# Patient Record
Sex: Female | Born: 1970 | Race: Black or African American | Hispanic: No | Marital: Single | Smoking: Never smoker
Health system: Southern US, Community
[De-identification: ages and names within clinical notes are randomized; demographics above are authoritative.]

## PROBLEM LIST (undated history)

## (undated) DIAGNOSIS — D649 Anemia, unspecified: Secondary | ICD-10-CM

## (undated) DIAGNOSIS — D219 Benign neoplasm of connective and other soft tissue, unspecified: Secondary | ICD-10-CM

## (undated) DIAGNOSIS — Z789 Other specified health status: Secondary | ICD-10-CM

## (undated) HISTORY — DX: Benign neoplasm of connective and other soft tissue, unspecified: D21.9

## (undated) HISTORY — DX: Other specified health status: Z78.9

---

## 1994-09-08 HISTORY — PX: TUBAL LIGATION: SHX77

## 1998-10-29 ENCOUNTER — Emergency Department (HOSPITAL_COMMUNITY): Admission: EM | Admit: 1998-10-29 | Discharge: 1998-10-29 | Payer: Self-pay

## 1999-02-13 ENCOUNTER — Encounter: Payer: Self-pay | Admitting: Emergency Medicine

## 1999-02-13 ENCOUNTER — Emergency Department (HOSPITAL_COMMUNITY): Admission: EM | Admit: 1999-02-13 | Discharge: 1999-02-13 | Payer: Self-pay | Admitting: Emergency Medicine

## 2000-04-03 ENCOUNTER — Emergency Department (HOSPITAL_COMMUNITY): Admission: EM | Admit: 2000-04-03 | Discharge: 2000-04-03 | Payer: Self-pay | Admitting: Emergency Medicine

## 2005-11-05 ENCOUNTER — Emergency Department (HOSPITAL_COMMUNITY): Admission: EM | Admit: 2005-11-05 | Discharge: 2005-11-05 | Payer: Self-pay | Admitting: Emergency Medicine

## 2008-01-11 ENCOUNTER — Encounter (INDEPENDENT_AMBULATORY_CARE_PROVIDER_SITE_OTHER): Payer: Self-pay | Admitting: Family Medicine

## 2009-10-09 ENCOUNTER — Emergency Department (HOSPITAL_COMMUNITY): Admission: EM | Admit: 2009-10-09 | Discharge: 2009-10-09 | Payer: Self-pay | Admitting: Emergency Medicine

## 2010-10-11 ENCOUNTER — Encounter: Payer: Self-pay | Admitting: *Deleted

## 2010-10-11 ENCOUNTER — Inpatient Hospital Stay (HOSPITAL_COMMUNITY)
Admission: RE | Admit: 2010-10-11 | Discharge: 2010-10-11 | Disposition: A | Discharge status: Home / Self Care | Payer: Self-pay | Source: Ambulatory Visit | Attending: Family Medicine | Admitting: Family Medicine

## 2010-10-11 ENCOUNTER — Emergency Department (HOSPITAL_COMMUNITY)
Admission: EM | Admit: 2010-10-11 | Discharge: 2010-10-11 | Disposition: A | Discharge status: Home / Self Care | Payer: Medicaid Other | Attending: Emergency Medicine | Admitting: Emergency Medicine

## 2010-10-11 DIAGNOSIS — R112 Nausea with vomiting, unspecified: Secondary | ICD-10-CM | POA: Insufficient documentation

## 2010-10-11 DIAGNOSIS — A5903 Trichomonal cystitis and urethritis: Secondary | ICD-10-CM | POA: Insufficient documentation

## 2010-10-11 DIAGNOSIS — R197 Diarrhea, unspecified: Secondary | ICD-10-CM | POA: Insufficient documentation

## 2010-10-11 LAB — POCT I-STAT, CHEM 8
BUN: 11 mg/dL (ref 6–23)
Calcium, Ion: 1.16 mmol/L (ref 1.12–1.32)
Chloride: 107 mEq/L (ref 96–112)
Creatinine, Ser: 0.8 mg/dL (ref 0.4–1.2)
Glucose, Bld: 115 mg/dL — ABNORMAL HIGH (ref 70–99)
HCT: 36 % (ref 36.0–46.0)
Hemoglobin: 12.2 g/dL (ref 12.0–15.0)
Potassium: 3.6 mEq/L (ref 3.5–5.1)
Sodium: 141 mEq/L (ref 135–145)
TCO2: 24 mmol/L (ref 0–100)

## 2010-10-11 LAB — URINALYSIS, ROUTINE W REFLEX MICROSCOPIC
Bilirubin Urine: NEGATIVE
Hgb urine dipstick: NEGATIVE
Ketones, ur: NEGATIVE mg/dL
Nitrite: NEGATIVE
Protein, ur: NEGATIVE mg/dL
Specific Gravity, Urine: 1.017 (ref 1.005–1.030)
Urine Glucose, Fasting: NEGATIVE mg/dL
Urobilinogen, UA: 0.2 mg/dL (ref 0.0–1.0)
pH: 5.5 (ref 5.0–8.0)

## 2010-10-11 LAB — URINE MICROSCOPIC-ADD ON

## 2010-10-11 LAB — POCT PREGNANCY, URINE: Preg Test, Ur: NEGATIVE

## 2010-10-13 LAB — URINE CULTURE
Colony Count: 10000
Culture  Setup Time: 201202040140

## 2010-10-16 NOTE — Miscellaneous (Signed)
Summary: [facility] asked for NPI  Clinical Lists Changes Ida from [facility] left a message asking for our NPI. She has never been here. lm for Ida to call back.Golden [address] RN  October 11, 2010 9:51 AM

## 2012-11-30 ENCOUNTER — Emergency Department (HOSPITAL_COMMUNITY)
Admission: EM | Admit: 2012-11-30 | Discharge: 2012-11-30 | Disposition: A | Discharge status: Home / Self Care | Payer: Medicaid Other | Attending: Emergency Medicine | Admitting: Emergency Medicine

## 2012-11-30 ENCOUNTER — Encounter (HOSPITAL_COMMUNITY): Payer: Self-pay | Admitting: *Deleted

## 2012-11-30 DIAGNOSIS — A5901 Trichomonal vulvovaginitis: Secondary | ICD-10-CM | POA: Insufficient documentation

## 2012-11-30 DIAGNOSIS — R350 Frequency of micturition: Secondary | ICD-10-CM | POA: Insufficient documentation

## 2012-11-30 DIAGNOSIS — M549 Dorsalgia, unspecified: Secondary | ICD-10-CM

## 2012-11-30 DIAGNOSIS — M546 Pain in thoracic spine: Secondary | ICD-10-CM | POA: Insufficient documentation

## 2012-11-30 DIAGNOSIS — A599 Trichomoniasis, unspecified: Secondary | ICD-10-CM

## 2012-11-30 DIAGNOSIS — Z3202 Encounter for pregnancy test, result negative: Secondary | ICD-10-CM | POA: Insufficient documentation

## 2012-11-30 DIAGNOSIS — R102 Pelvic and perineal pain: Secondary | ICD-10-CM

## 2012-11-30 DIAGNOSIS — Z7251 High risk heterosexual behavior: Secondary | ICD-10-CM | POA: Insufficient documentation

## 2012-11-30 DIAGNOSIS — N949 Unspecified condition associated with female genital organs and menstrual cycle: Secondary | ICD-10-CM | POA: Insufficient documentation

## 2012-11-30 LAB — URINALYSIS, ROUTINE W REFLEX MICROSCOPIC
Bilirubin Urine: NEGATIVE
Glucose, UA: NEGATIVE mg/dL
Ketones, ur: NEGATIVE mg/dL
Specific Gravity, Urine: 1.017 (ref 1.005–1.030)
pH: 7.5 (ref 5.0–8.0)

## 2012-11-30 LAB — POCT PREGNANCY, URINE: Preg Test, Ur: NEGATIVE

## 2012-11-30 LAB — URINE MICROSCOPIC-ADD ON

## 2012-11-30 LAB — WET PREP, GENITAL: Yeast Wet Prep HPF POC: NONE SEEN

## 2012-11-30 MED ORDER — AZITHROMYCIN 250 MG PO TABS
1000.0000 mg | ORAL_TABLET | Freq: Once | ORAL | Status: AC
  Administered 2012-11-30: 1000 mg via ORAL
  Filled 2012-11-30: qty 4

## 2012-11-30 MED ORDER — CEFTRIAXONE SODIUM 250 MG IJ SOLR
250.0000 mg | Freq: Once | INTRAMUSCULAR | Status: AC
  Administered 2012-11-30: 250 mg via INTRAMUSCULAR
  Filled 2012-11-30: qty 250

## 2012-11-30 MED ORDER — METRONIDAZOLE 500 MG PO TABS
2000.0000 mg | ORAL_TABLET | Freq: Once | ORAL | Status: AC
  Administered 2012-11-30: 2000 mg via ORAL
  Filled 2012-11-30: qty 4

## 2012-11-30 NOTE — ED Notes (Signed)
Pt reports back pain x1 month. Abd pain x2 weeks. Denies n/v/d, constipation. Pt reports urinary frequency, urgency x1 month. Also reports she has been having unprotected sex with her ex and concerned for stds. Denies sx at this time.

## 2012-11-30 NOTE — ED Provider Notes (Signed)
Medical screening examination/treatment/procedure(s) were performed by non-physician practitioner and as supervising physician I was immediately available for consultation/collaboration.  Naphtali Zywicki R. Denaisha Swango, MD 11/30/12 2307 

## 2012-11-30 NOTE — ED Provider Notes (Signed)
History     CSN: [id number]  Arrival date & time 11/30/12  1804   First MD Initiated Contact with Patient 11/30/12 1826      Chief Complaint  Patient presents with  . Abdominal Pain  . Back Pain    (Consider location/radiation/quality/duration/timing/severity/associated sxs/prior treatment) Patient is a 42 y.o. female presenting with abdominal pain and back pain. The history is provided by the patient. No language interpreter was used.  Abdominal Pain Pain location:  Suprapubic and LLQ Pain quality: cramping   Pain radiates to:  Suprapubic region Pain severity:  Moderate Associated symptoms: no chills, no dysuria, no fever, no nausea and no vomiting   Back Pain Associated symptoms: abdominal pain and pelvic pain   Associated symptoms: no dysuria and no fever    Abdominal pain that started about 2 weeks ago that is moderate in severity and cramping in nature. Location: lower abdomen, in left suprapubic region  Radiates throughout abdomen Prior episodes: none Prior endoscopies or abd surgeries: tubleligation  Prior US or CT (when? What): none Prior hx of similar pain (do they have antiemetic or pain meds): no hx of similar pain Pt does not have a OB/GYN.  States she has recently been having unprotected sex with her ex and is concerned for STDs.  Denies fever, n/v/d, dysuria, or vaginal discharge.  LMP last week and "normal" for patient.  Pt also mentioned right back pain in thoracic region.  States pain is sharp in nature, waxes and wanes.  Has been going on for about 28mo.  No known trauma or specific injury.  Has tried ibuprofen with moderate relief.  History reviewed. No pertinent past medical history.  History reviewed. No pertinent past surgical history.  No family history on file.  History  Substance Use Topics  . Smoking status: Never Smoker   . Smokeless tobacco: Not on file  . Alcohol Use: Yes     Comment: occasionally    OB History   Grav Para Term  Preterm Abortions TAB SAB Ect Mult Living                  Review of Systems  Constitutional: Negative for fever and chills.  Gastrointestinal: Positive for abdominal pain. Negative for nausea and vomiting.  Genitourinary: Positive for pelvic pain. Negative for dysuria.  Musculoskeletal: Positive for back pain.    Allergies  Review of patient's allergies indicates no known allergies.  Home Medications  No current outpatient prescriptions on file.  BP 136/74  Pulse 82  Temp(Src) 98.7 F (37.1 C) (Oral)  Resp 16  SpO2 100%  LMP 11/23/2012  Physical Exam  Nursing note and vitals reviewed. Constitutional: She appears well-developed and well-nourished. No distress.  HENT:  Head: Normocephalic and atraumatic.  Eyes: Conjunctivae are normal.  Neck: Normal range of motion.  Cardiovascular: Normal rate, regular rhythm and normal heart sounds.   Pulmonary/Chest: Effort normal and breath sounds normal.  Abdominal: Soft. Bowel sounds are normal. She exhibits no distension and no mass. There is tenderness ( moderate TTP in suprapubic region). There is no rebound and no guarding.  Genitourinary: Uterus normal. Vaginal discharge ( moderate amount of thick white discharge) found.  External genitalia normal.  No lesions, erythema or masses.  Pelvic exam: moderate amount of white thick discharge, vaginal canal smooth without lesions or masses.  Cervical os closed with white discharge.  No erythema or lesions.   Bimanual: no cervical motion tenderness or adnexal masses  Musculoskeletal: Normal range of motion.  Neurological: She is alert.  Skin: Skin is dry. She is not diaphoretic.    ED Course  Procedures (including critical care time)  Labs Reviewed  WET PREP, GENITAL - Abnormal; Notable for the following:    Trich, Wet Prep MANY (*)    Clue Cells Wet Prep HPF POC FEW (*)    WBC, Wet Prep HPF POC FEW (*)    All other components within normal limits  URINALYSIS, ROUTINE W REFLEX  MICROSCOPIC - Abnormal; Notable for the following:    Leukocytes, UA SMALL (*)    All other components within normal limits  GC/CHLAMYDIA PROBE AMP  URINE MICROSCOPIC-ADD ON  POCT PREGNANCY, URINE   No results found.   1. Trichomoniasis   2. Pelvic pain   3. Back pain       MDM  Pt was concerned for STDs after unprotected sex with ex.  Has had suprapubic pain for 2 weeks.  Denies n/v/d, dysuria, or fever.  Has had urinary frequency x31mo.  LMP last week and reported as "normal" for pt.  Pelvic exam significant for moderate amount of white thick discharge.  No cervical motion tenderness or adnexal masses.  No external or internal lesions or erythema.  HCG: neg UA: not suggestive of UTI Wet prep: many trich with few clue cells  Will tx pt empirically for gonorrhea and clamydia based on symptoms and pelvic exam and tx for trichomoniasis based on wet prep. Ceftriaxone 250mg  IM and Azythromycin 1g PO and Metronidazole 2g PO  For back pain, believed to be non-related to suprapubic pain and STD.  Based on hx and physical, likely due to musculoskeletal pain.  Advised pt to continue use of ibuprofen.  F/u with primary care in a week or two if pain persists.  May also use ice or heat as desired for pain.  Will discharge pt home.  Have pt inform partner needs to be tested and treated for STDs.  Pt should f/u with health clinic or primary care in a week to make sure infection has cleared.  Vitals: unremarkable. Discharged in stable condition.    Discussed pt with attending during ED encounter.         Junius Finner, PA-C 11/30/12 2100

## 2012-12-01 LAB — GC/CHLAMYDIA PROBE AMP
CT Probe RNA: NEGATIVE
GC Probe RNA: NEGATIVE

## 2014-03-13 ENCOUNTER — Emergency Department (HOSPITAL_COMMUNITY)
Admission: EM | Admit: 2014-03-13 | Discharge: 2014-03-13 | Disposition: A | Discharge status: Home / Self Care | Payer: Medicaid Other | Attending: Emergency Medicine | Admitting: Emergency Medicine

## 2014-03-13 ENCOUNTER — Encounter (HOSPITAL_COMMUNITY): Payer: Self-pay | Admitting: Emergency Medicine

## 2014-03-13 DIAGNOSIS — L299 Pruritus, unspecified: Secondary | ICD-10-CM

## 2014-03-13 DIAGNOSIS — R21 Rash and other nonspecific skin eruption: Secondary | ICD-10-CM

## 2014-03-13 DIAGNOSIS — L259 Unspecified contact dermatitis, unspecified cause: Secondary | ICD-10-CM | POA: Insufficient documentation

## 2014-03-13 MED ORDER — DIPHENHYDRAMINE HCL 25 MG PO TABS: 25.0000 mg | ORAL_TABLET | Freq: Four times a day (QID) | ORAL | Status: DC | PRN

## 2014-03-13 MED ORDER — DIPHENHYDRAMINE HCL 25 MG PO CAPS
25.0000 mg | ORAL_CAPSULE | Freq: Once | ORAL | Status: AC
  Administered 2014-03-13: 25 mg via ORAL
  Filled 2014-03-13: qty 1

## 2014-03-13 MED ORDER — PREDNISONE 20 MG PO TABS
60.0000 mg | ORAL_TABLET | Freq: Once | ORAL | Status: AC
  Administered 2014-03-13: 60 mg via ORAL
  Filled 2014-03-13: qty 3

## 2014-03-13 MED ORDER — PREDNISONE 20 MG PO TABS: ORAL_TABLET | ORAL | Status: DC

## 2014-03-13 NOTE — ED Notes (Signed)
Pt arrived to the ED with a complaint of a rash.  Pt has raised petechiae on her upper body.  Pt states she noticed the rash start after she cleaned a apartment.  Pt states the rash has been present for 3 days.

## 2014-03-13 NOTE — Discharge Instructions (Signed)
Keep skin clean/dry. Avoid using that new oil, also avoid other perfumed/scented body products for the next several days.  You may use mild, unscented soaps such as dove.  Take prednisone as prescribed. Take benadryl as need for itching.  Benadryl may cause drowsiness - no driving if/when feeling drowsy. Follow up with primary care doctor/dermatologist in 1 week if symptoms fail to improve/resolve. Return to ER if worse, new symptoms, fevers, trouble breathing, other concern.     Contact Dermatitis Contact dermatitis is a reaction to certain substances that touch the skin. Contact dermatitis can be either irritant contact dermatitis or allergic contact dermatitis. Irritant contact dermatitis does not require previous exposure to the substance for a reaction to occur.Allergic contact dermatitis only occurs if you have been exposed to the substance before. Upon a repeat exposure, your body reacts to the substance.  CAUSES  Many substances can cause contact dermatitis. Irritant dermatitis is most commonly caused by repeated exposure to mildly irritating substances, such as:  Makeup.  Soaps.  Detergents.  Bleaches.  Acids.  Metal salts, such as nickel. Allergic contact dermatitis is most commonly caused by exposure to:  Poisonous plants.  Chemicals (deodorants, shampoos).  Jewelry.  Latex.  Neomycin in triple antibiotic cream.  Preservatives in products, including clothing. SYMPTOMS  The area of skin that is exposed may develop:  Dryness or flaking.  Redness.  Cracks.  Itching.  Pain or a burning sensation.  Blisters. With allergic contact dermatitis, there may also be swelling in areas such as the eyelids, mouth, or genitals.  DIAGNOSIS  Your caregiver can usually tell what the problem is by doing a physical exam. In cases where the cause is uncertain and an allergic contact dermatitis is suspected, a patch skin test may be performed to help determine the cause  of your dermatitis. TREATMENT Treatment includes protecting the skin from further contact with the irritating substance by avoiding that substance if possible. Barrier creams, powders, and gloves may be helpful. Your caregiver may also recommend:  Steroid creams or ointments applied 2 times daily. For best results, soak the rash area in cool water for 20 minutes. Then apply the medicine. Cover the area with a plastic wrap. You can store the steroid cream in the refrigerator for a "chilly" effect on your rash. That may decrease itching. Oral steroid medicines may be needed in more severe cases.  Antibiotics or antibacterial ointments if a skin infection is present.  Antihistamine lotion or an antihistamine taken by mouth to ease itching.  Lubricants to keep moisture in your skin.  Burow's solution to reduce redness and soreness or to dry a weeping rash. Mix one packet or tablet of solution in 2 cups cool water. Dip a clean washcloth in the mixture, wring it out a bit, and put it on the affected area. Leave the cloth in place for 30 minutes. Do this as often as possible throughout the day.  Taking several cornstarch or baking soda baths daily if the area is too large to cover with a washcloth. Harsh chemicals, such as alkalis or acids, can cause skin damage that is like a burn. You should flush your skin for 15 to 20 minutes with cold water after such an exposure. You should also seek immediate medical care after exposure. Bandages (dressings), antibiotics, and pain medicine may be needed for severely irritated skin.  HOME CARE INSTRUCTIONS  Avoid the substance that caused your reaction.  Keep the area of skin that is affected away from hot  water, soap, sunlight, chemicals, acidic substances, or anything else that would irritate your skin.  Do not scratch the rash. Scratching may cause the rash to become infected.  You may take cool baths to help stop the itching.  Only take over-the-counter  or prescription medicines as directed by your caregiver.  See your caregiver for follow-up care as directed to make sure your skin is healing properly. SEEK MEDICAL CARE IF:   Your condition is not better after 3 days of treatment.  You seem to be getting worse.  You see signs of infection such as swelling, tenderness, redness, soreness, or warmth in the affected area.  You have any problems related to your medicines. Document Released: 08/22/2000 Document Revised: 11/17/2011 Document Reviewed: 01/28/2011 [facility] Patient Information 2015 [place], [place]. This information is not intended to replace advice given to you by your health care provider. Make sure you discuss any questions you have with your health care provider.

## 2014-03-13 NOTE — ED Provider Notes (Signed)
CSN: [id number]     Arrival date & time 03/13/14  0719 History   First MD Initiated Contact with Patient 03/13/14 [phone]     Chief Complaint  Patient presents with  . Rash     (Consider location/radiation/quality/duration/timing/severity/associated sxs/prior Treatment) Patient is a 43 y.o. female presenting with rash. The history is provided by the patient.  Rash Associated symptoms: no abdominal pain, no diarrhea, no fever, no headaches, no shortness of breath, no sore throat and not vomiting   pt c/o pruritic rash to upper chest/base neck, bilateral arms for the past 2-3 days. Constant. Moderate. Denies hx similar rash. States had recently started using new scented body oil.  No other recent change in any home or personal products.  No recent new meds.  No change in foods/diet. States also had done some recent cleaning in a dirty apartment, but denies known chemical or other exposure to skin that environment. Denies outdoor/yard work or exposure. Recent health otherwise at baseline, no recent febrile illness. No uri c/o. States otherwise feels well, not ill.       History reviewed. No pertinent past medical history. History reviewed. No pertinent past surgical history. History reviewed. No pertinent family history. History  Substance Use Topics  . Smoking status: Never Smoker   . Smokeless tobacco: Not on file  . Alcohol Use: Yes     Comment: occasionally   OB History   Grav Para Term Preterm Abortions TAB SAB Ect Mult Living                 Review of Systems  Constitutional: Negative for fever and chills.  HENT: Negative for sore throat.   Eyes: Negative for pain, discharge and redness.  Respiratory: Negative for cough and shortness of breath.   Cardiovascular: Negative for chest pain and leg swelling.  Gastrointestinal: Negative for vomiting, abdominal pain and diarrhea.  Genitourinary: Negative for dysuria and flank pain.  Musculoskeletal: Negative for back pain and neck  pain.  Skin: Positive for rash.  Neurological: Negative for headaches.  Hematological: Does not bruise/bleed easily.  Psychiatric/Behavioral: Negative for confusion.      Allergies  Review of patient's allergies indicates no known allergies.  Home Medications   Prior to Admission medications   Medication Sig Start Date End Date Taking? Authorizing Provider  diphenhydrAMINE (BENADRYL) 25 MG tablet Take 1 tablet (25 mg total) by mouth every 6 (six) hours as needed. 03/13/14   Mirna Mires, MD  predniSONE (DELTASONE) 20 MG tablet 3 po once a day for 2 days, then 2 po once a day for 3 days, then 1 po once a day for 3 days 03/13/14   Mirna Mires, MD   BP 118/58  Pulse 65  Temp(Src) 98.5 F (36.9 C) (Oral)  Resp 16  Ht 5\' 4"  (1.626 m)  Wt 150 lb (68.04 kg)  BMI 25.73 kg/m2  SpO2 100%  LMP 03/03/2014 Physical Exam  Nursing note and vitals reviewed. Constitutional: She appears well-developed and well-nourished. No distress.  HENT:  Nose: Nose normal.  Mouth/Throat: Oropharynx is clear and moist.  No mm involvement of rash  Eyes: Conjunctivae are normal. Right eye exhibits no discharge. Left eye exhibits no discharge. No scleral icterus.  Neck: Neck supple. No tracheal deviation present.  Cardiovascular: Normal rate, regular rhythm, normal heart sounds and intact distal pulses.   Pulmonary/Chest: Effort normal and breath sounds normal. No respiratory distress. She has no wheezes.  Abdominal: Soft. Normal appearance. She exhibits no  distension. There is no tenderness.  Musculoskeletal: She exhibits no edema and no tenderness.  Lymphadenopathy:    She has no cervical adenopathy.  Neurological: She is alert.  Skin: Skin is warm and dry. Rash noted. She is not diaphoretic.  Blotchy, erythematous, sl raised rash to neck/base or neck, upper chest, bilateral arms c/w contact dermatitis. Few linear areas. No petechia or purpura. No target or iris lesions. No mucous membrane lesions. No  cellulitis.   Psychiatric: She has a normal mood and affect.    ED Course  Procedures (including critical care time)   MDM  No meds pta this morning.   Confirmed nkda w pt.  Benadryl po, pred po.  Reviewed nursing notes and prior charts for additional history.       Mirna Mires, MD 03/13/14 [phone]

## 2018-01-17 DIAGNOSIS — D500 Iron deficiency anemia secondary to blood loss (chronic): Secondary | ICD-10-CM | POA: Insufficient documentation

## 2018-01-17 DIAGNOSIS — N920 Excessive and frequent menstruation with regular cycle: Principal | ICD-10-CM | POA: Insufficient documentation

## 2018-01-17 DIAGNOSIS — D250 Submucous leiomyoma of uterus: Secondary | ICD-10-CM | POA: Insufficient documentation

## 2018-01-18 ENCOUNTER — Emergency Department (HOSPITAL_COMMUNITY): Payer: Self-pay

## 2018-01-18 ENCOUNTER — Encounter (HOSPITAL_COMMUNITY): Payer: Self-pay

## 2018-01-18 ENCOUNTER — Telehealth: Payer: Self-pay | Admitting: General Practice

## 2018-01-18 ENCOUNTER — Other Ambulatory Visit: Payer: Self-pay

## 2018-01-18 ENCOUNTER — Observation Stay (HOSPITAL_COMMUNITY)
Admission: EM | Admit: 2018-01-18 | Discharge: 2018-01-19 | Disposition: A | Discharge status: Home / Self Care | Payer: Self-pay | Attending: Internal Medicine | Admitting: Internal Medicine

## 2018-01-18 DIAGNOSIS — N921 Excessive and frequent menstruation with irregular cycle: Secondary | ICD-10-CM

## 2018-01-18 DIAGNOSIS — D259 Leiomyoma of uterus, unspecified: Secondary | ICD-10-CM

## 2018-01-18 DIAGNOSIS — N920 Excessive and frequent menstruation with regular cycle: Secondary | ICD-10-CM | POA: Diagnosis present

## 2018-01-18 DIAGNOSIS — D649 Anemia, unspecified: Secondary | ICD-10-CM | POA: Diagnosis present

## 2018-01-18 DIAGNOSIS — N939 Abnormal uterine and vaginal bleeding, unspecified: Secondary | ICD-10-CM

## 2018-01-18 DIAGNOSIS — D500 Iron deficiency anemia secondary to blood loss (chronic): Secondary | ICD-10-CM

## 2018-01-18 LAB — RETICULOCYTES
RBC.: 1.95 MIL/uL — AB (ref 3.87–5.11)
RETIC COUNT ABSOLUTE: 19.5 10*3/uL (ref 19.0–186.0)
Retic Ct Pct: 1 % (ref 0.4–3.1)

## 2018-01-18 LAB — APTT: aPTT: 25 seconds (ref 24–36)

## 2018-01-18 LAB — CBC
HCT: 14.6 % — ABNORMAL LOW (ref 36.0–46.0)
Hemoglobin: 3.8 g/dL — CL (ref 12.0–15.0)
MCH: 14.9 pg — ABNORMAL LOW (ref 26.0–34.0)
MCHC: 26 g/dL — ABNORMAL LOW (ref 30.0–36.0)
MCV: 57.3 fL — AB (ref 78.0–100.0)
PLATELETS: 327 10*3/uL (ref 150–400)
RBC: 2.55 MIL/uL — AB (ref 3.87–5.11)
RDW: 19.7 % — AB (ref 11.5–15.5)
WBC: 4.7 10*3/uL (ref 4.0–10.5)

## 2018-01-18 LAB — IRON AND TIBC
Iron: 6 ug/dL — ABNORMAL LOW (ref 28–170)
Saturation Ratios: 2 % — ABNORMAL LOW (ref 10.4–31.8)
TIBC: 386 ug/dL (ref 250–450)
UIBC: 380 ug/dL

## 2018-01-18 LAB — BASIC METABOLIC PANEL
Anion gap: 9 (ref 5–15)
BUN: 11 mg/dL (ref 6–20)
CALCIUM: 9.6 mg/dL (ref 8.9–10.3)
CO2: 22 mmol/L (ref 22–32)
Chloride: 109 mmol/L (ref 101–111)
Creatinine, Ser: 0.87 mg/dL (ref 0.44–1.00)
Glucose, Bld: 140 mg/dL — ABNORMAL HIGH (ref 65–99)
Potassium: 3.4 mmol/L — ABNORMAL LOW (ref 3.5–5.1)
SODIUM: 140 mmol/L (ref 135–145)

## 2018-01-18 LAB — I-STAT BETA HCG BLOOD, ED (MC, WL, AP ONLY)

## 2018-01-18 LAB — URINALYSIS, MICROSCOPIC (REFLEX): Squamous Epithelial / LPF: NONE SEEN (ref 0–5)

## 2018-01-18 LAB — FERRITIN: Ferritin: 1 ng/mL — ABNORMAL LOW (ref 11–307)

## 2018-01-18 LAB — URINALYSIS, ROUTINE W REFLEX MICROSCOPIC
Bilirubin Urine: NEGATIVE
Glucose, UA: NEGATIVE mg/dL
KETONES UR: NEGATIVE mg/dL
Nitrite: NEGATIVE
Protein, ur: >300 mg/dL — AB
Specific Gravity, Urine: 1.02 (ref 1.005–1.030)
pH: 7.5 (ref 5.0–8.0)

## 2018-01-18 LAB — HIV ANTIBODY (ROUTINE TESTING W REFLEX): HIV Screen 4th Generation wRfx: NONREACTIVE

## 2018-01-18 LAB — HEMOGLOBIN AND HEMATOCRIT, BLOOD
HCT: 22.9 % — ABNORMAL LOW (ref 36.0–46.0)
Hemoglobin: 7.1 g/dL — ABNORMAL LOW (ref 12.0–15.0)

## 2018-01-18 LAB — PREPARE RBC (CROSSMATCH)

## 2018-01-18 LAB — PROTIME-INR
INR: 0.85
Prothrombin Time: 11.6 seconds (ref 11.4–15.2)

## 2018-01-18 LAB — ABO/RH: ABO/RH(D): A POS

## 2018-01-18 LAB — FOLATE: FOLATE: 10.1 ng/mL (ref >5.9)

## 2018-01-18 LAB — VITAMIN B12: VITAMIN B 12: 845 pg/mL (ref 180–914)

## 2018-01-18 MED ORDER — ACETAMINOPHEN 325 MG PO TABS: 650.0000 mg | ORAL_TABLET | Freq: Four times a day (QID) | ORAL | Status: DC | PRN

## 2018-01-18 MED ORDER — SODIUM CHLORIDE 0.9% FLUSH
3.0000 mL | Freq: Two times a day (BID) | INTRAVENOUS | Status: DC
  Administered 2018-01-18: 3 mL via INTRAVENOUS

## 2018-01-18 MED ORDER — ACETAMINOPHEN 650 MG RE SUPP: 650.0000 mg | Freq: Four times a day (QID) | RECTAL | Status: DC | PRN

## 2018-01-18 MED ORDER — SODIUM CHLORIDE 0.9 % IV SOLN
Freq: Once | INTRAVENOUS | Status: AC
  Administered 2018-01-18: 07:00:00 via INTRAVENOUS

## 2018-01-18 MED ORDER — TRAMADOL HCL 50 MG PO TABS
50.0000 mg | ORAL_TABLET | Freq: Four times a day (QID) | ORAL | Status: DC | PRN
  Administered 2018-01-18: 50 mg via ORAL
  Filled 2018-01-18: qty 1

## 2018-01-18 MED ORDER — MEGESTROL ACETATE 40 MG PO TABS
40.0000 mg | ORAL_TABLET | Freq: Two times a day (BID) | ORAL | Status: DC
  Administered 2018-01-18 – 2018-01-19 (×3): 40 mg via ORAL
  Filled 2018-01-18 (×4): qty 1

## 2018-01-18 MED ORDER — POTASSIUM CHLORIDE CRYS ER 20 MEQ PO TBCR
20.0000 meq | EXTENDED_RELEASE_TABLET | ORAL | Status: AC
  Administered 2018-01-18: 20 meq via ORAL
  Filled 2018-01-18: qty 1

## 2018-01-18 MED ORDER — ALBUTEROL SULFATE (2.5 MG/3ML) 0.083% IN NEBU: 2.5000 mg | INHALATION_SOLUTION | Freq: Four times a day (QID) | RESPIRATORY_TRACT | Status: DC | PRN

## 2018-01-18 MED ORDER — SODIUM CHLORIDE 0.9 % IV BOLUS
1000.0000 mL | Freq: Once | INTRAVENOUS | Status: AC
  Administered 2018-01-18: 1000 mL via INTRAVENOUS

## 2018-01-18 MED ORDER — ONDANSETRON HCL 4 MG/2ML IJ SOLN: 4.0000 mg | Freq: Four times a day (QID) | INTRAMUSCULAR | Status: DC | PRN

## 2018-01-18 MED ORDER — ONDANSETRON HCL 4 MG PO TABS: 4.0000 mg | ORAL_TABLET | Freq: Four times a day (QID) | ORAL | Status: DC | PRN

## 2018-01-18 NOTE — ED Notes (Signed)
Bed: WA05 Expected date:  Expected time:  Means of arrival:  Comments: 

## 2018-01-18 NOTE — Progress Notes (Signed)
  PROGRESS NOTE  Patient admitted earlier this morning. See H&P. Tasha Payne is a 47 y.o. female without significant past medical history, who reports with complaints of heavy vaginal bleeding.  Symptoms started and January of this year with heavy bleeding during menstrual cycle, and then had no period in February.  For the last 3 weeks she reports having having bleeding with her menstrual cycle where she is saturating a full box of pads a day.  Notes passing blood clots as well.  She is been significantly fatigued, chills, and short of breath with exertion.  Denies any lightheadedness, chest pain, abdominal pain, nausea, vomiting, dysuria, NSAID use, blood thinner use, or history of clotting disorder. In the ED, Hgb was 3.8. Blood transfusions were ordered. Transvaginal US revealed large fibroid uterus with endometrial thickness of 5.56mm. Dr. Ihor Dow OB/GYN was consulted who recommend outpatient follow up for work up, medical treatment for anemia, and to begin Megace 40mg  BID.   Transfuse 3u pRBC ordered. Repeat H&H after transfusions complete Iron level pending Replace K    Dessa Phi, DO [facility] [email or web address] Password [facility] 01/18/2018, 12:11 PM

## 2018-01-18 NOTE — ED Triage Notes (Signed)
Pt reports vaginal bleeding for the past 3 weeks. She reports that she saturates a full box of pads a day and is starting to pass blood clots. Denies pain or trouble urinating. A&Ox4.

## 2018-01-18 NOTE — ED Notes (Signed)
ED TO INPATIENT HANDOFF REPORT  Name/Age/Gender Tasha Payne 47 y.o. female  Code Status    Code Status Orders  (From admission, onward)        Start     Ordered   01/18/18 0552  Full code  Continuous     01/18/18 0556    Code Status History    This patient has a current code status but no historical code status.      Home/SNF/Other Home  Chief Complaint Vaginal Bleeding-3 weeks   Level of Care/Admitting Diagnosis ED Disposition    ED Disposition Condition Comment   Admit  Hospital Area: [facility] [[id number]]  Level of Care: Telemetry [5]  Admit to tele based on following criteria: Complex arrhythmia (Bradycardia/Tachycardia)  Diagnosis: Symptomatic anemia [9833825]  Admitting Physician: Norval Morton [[id number]]  Attending Physician: Norval Morton [[id number]]  PT Class (Do Not Modify): Observation [104]  PT Acc Code (Do Not Modify): Observation [10022]       Medical History History reviewed. No pertinent past medical history.  Allergies No Known Allergies  IV Location/Drains/Wounds Patient Lines/Drains/Airways Status   Active Line/Drains/Airways    Name:   Placement date:   Placement time:   Site:   Days:   Peripheral IV 01/18/18 Left Antecubital   01/18/18    0233    Antecubital   less than 1          Labs/Imaging Results for orders placed or performed during the hospital encounter of 01/18/18 (from the past 48 hour(s))  Basic metabolic panel     Status: Abnormal   Collection Time: 01/18/18  1:22 AM  Result Value Ref Range   Sodium 140 135 - 145 mmol/L   Potassium 3.4 (L) 3.5 - 5.1 mmol/L   Chloride 109 101 - 111 mmol/L   CO2 22 22 - 32 mmol/L   Glucose, Bld 140 (H) 65 - 99 mg/dL   BUN 11 6 - 20 mg/dL   Creatinine, Ser 0.87 0.44 - 1.00 mg/dL   Calcium 9.6 8.9 - 10.3 mg/dL   GFR calc non Af Amer >60 >60 mL/min   GFR calc Af Amer >60 >60 mL/min    Comment: (NOTE) The eGFR has been calculated using the CKD EPI  equation. This calculation has not been validated in all clinical situations. eGFR's persistently <60 mL/min signify possible Chronic Kidney Disease.    Anion gap 9 5 - 15    Comment: Performed at [facility], [place] [address] [place], [place] [address]  CBC     Status: Abnormal   Collection Time: 01/18/18  1:22 AM  Result Value Ref Range   WBC 4.7 4.0 - 10.5 K/uL   RBC 2.55 (L) 3.87 - 5.11 MIL/uL   Hemoglobin 3.8 (LL) 12.0 - 15.0 g/dL    Comment: REPEATED TO VERIFY CRITICAL RESULT CALLED TO, READ BACK BY AND VERIFIED WITH: L ADKINS,RN 01/18/18 0151 RHOLMES    HCT 14.6 (L) 36.0 - 46.0 %   MCV 57.3 (L) 78.0 - 100.0 fL   MCH 14.9 (L) 26.0 - 34.0 pg   MCHC 26.0 (L) 30.0 - 36.0 g/dL   RDW 19.7 (H) 11.5 - 15.5 %   Platelets 327 150 - 400 K/uL    Comment: Performed at [facility], [place] [address], [place], [place] [address]  Protime-INR     Status: None   Collection Time: 01/18/18  1:22 AM  Result Value Ref Range   Prothrombin  Time 11.6 11.4 - 15.2 seconds   INR 0.85     Comment: Performed at [facility], [place] [address] [place], [place] [address]  I-Stat beta hCG blood, ED     Status: None   Collection Time: 01/18/18  1:26 AM  Result Value Ref Range   I-stat hCG, quantitative <5.0 <5 mIU/mL   Comment 3            Comment:   GEST. AGE      CONC.  (mIU/mL)   <=1 WEEK        5 - 50     2 WEEKS       50 - 500     3 WEEKS       100 - 10,000     4 WEEKS     1,000 - 30,000        FEMALE AND NON-PREGNANT FEMALE:     LESS THAN 5 mIU/mL   Urinalysis, Routine w reflex microscopic     Status: Abnormal   Collection Time: 01/18/18  2:13 AM  Result Value Ref Range   Color, Urine RED (A) YELLOW    Comment: BIOCHEMICALS MAY BE AFFECTED BY COLOR   APPearance CLOUDY (A) CLEAR   Specific Gravity, Urine 1.020 1.005 - 1.030   pH 7.5 5.0 - 8.0   Glucose, UA NEGATIVE NEGATIVE mg/dL   Hgb urine dipstick LARGE (A) NEGATIVE   Bilirubin  Urine NEGATIVE NEGATIVE   Ketones, ur NEGATIVE NEGATIVE mg/dL   Protein, ur >300 (A) NEGATIVE mg/dL   Nitrite NEGATIVE NEGATIVE   Leukocytes, UA TRACE (A) NEGATIVE    Comment: Performed at [facility], [place] [address], [place], [place] [address]  Type and screen [place]     Status: None (Preliminary result)   Collection Time: 01/18/18  2:13 AM  Result Value Ref Range   ABO/RH(D) A POS    Antibody Screen NEG    Sample Expiration 01/21/2018    Unit Number [id number]    Blood Component Type RED CELLS,LR    Unit division 00    Status of Unit ALLOCATED    Transfusion Status OK TO TRANSFUSE    Crossmatch Result Compatible    Unit Number [id number]    Blood Component Type RED CELLS,LR    Unit division 00    Status of Unit ISSUED    Transfusion Status OK TO TRANSFUSE    Crossmatch Result      Compatible Performed at [facility], [place] [address], [place], [place] [address]   Prepare RBC     Status: None   Collection Time: 01/18/18  2:13 AM  Result Value Ref Range   Order Confirmation      ORDER PROCESSED BY BLOOD BANK Performed at [place] [address], [place], [place] [address]   Urinalysis, Microscopic (reflex)     Status: Abnormal   Collection Time: 01/18/18  2:13 AM  Result Value Ref Range   RBC / HPF >50 0 - 5 RBC/hpf   WBC, UA 0-5 0 - 5 WBC/hpf   Bacteria, UA RARE (A) NONE SEEN   Squamous Epithelial / LPF NONE SEEN 0 - 5   Amorphous Crystal PRESENT     Comment: Performed at [facility], [place] [address], [place], [place] [address]  ABO/Rh     Status: None   Collection Time: 01/18/18  2:13 AM  Result Value Ref Range  ABO/RH(D)      A POS Performed at [facility], [place] [address] [place], [place] [address]   Reticulocytes     Status: Abnormal   Collection Time: 01/18/18  4:34 AM  Result Value Ref Range   Retic Ct Pct 1.0 0.4 - 3.1 %    RBC. 1.95 (L) 3.87 - 5.11 MIL/uL   Retic Count, Absolute 19.5 19.0 - 186.0 K/uL    Comment: Performed at [facility], [place] [address] [place], [place] [address]  APTT     Status: None   Collection Time: 01/18/18  6:23 AM  Result Value Ref Range   aPTT 25 24 - 36 seconds    Comment: Performed at [facility], [place] [address], [place], [place] [address]   US Pelvic Complete With Transvaginal  Result Date: 01/18/2018 CLINICAL DATA:  Vaginal bleeding for 3 weeks EXAM: TRANSABDOMINAL AND TRANSVAGINAL ULTRASOUND OF PELVIS TECHNIQUE: Both transabdominal and transvaginal ultrasound examinations of the pelvis were performed. Transabdominal technique was performed for global imaging of the pelvis including uterus, ovaries, adnexal regions, and pelvic cul-de-sac. It was necessary to proceed with endovaginal exam following the transabdominal exam to visualize the ovaries. COMPARISON:  None FINDINGS: Uterus Measurements: 11.3 x 6.2 x 6.6 cm. Multiple prominent fibroids. Left lateral pedunculated fibroid measures 3.8 x 2.9 x 3.5 cm. Left submucosal fibroid measures 2.9 x 2.9 x 2.8 cm. Fundal fibroid measures 2.8 x 2.9 x 3.2 cm. Endometrium Thickness: 5.3 mm.  No focal abnormality visualized. Right ovary Measurements: 3 x 1.3 x 1.4 cm.  Normal appearance/no adnexal mass. Left ovary Not seen Other findings No abnormal free fluid. IMPRESSION: 1. Enlarged fibroid uterus 2. Endometrial thickness of 5.3 mm. If bleeding remains unresponsive to hormonal or medical therapy, sonohysterogram should be considered for focal lesion work-up. (Ref: Radiological Reasoning: Algorithmic Workup of Abnormal Vaginal Bleeding with Endovaginal Sonography and Sonohysterography. AJR 2008; 951:O84-16) 3. Nonvisualized left ovary Electronically Signed   By: Donavan Foil M.D.   On: 01/18/2018 04:02    Pending Labs Unresulted Labs (From admission, onward)   Start     Ordered   01/18/18 0553  HIV antibody  (Routine Testing)  Add-on,   R     01/18/18 0556   01/18/18 0248  Vitamin B12  (Anemia Panel (PNL))  STAT,   STAT     01/18/18 0247   01/18/18 0248  Folate  (Anemia Panel (PNL))  STAT,   STAT     01/18/18 0247   01/18/18 0248  Iron and TIBC  (Anemia Panel (PNL))  STAT,   STAT     01/18/18 0247   01/18/18 0248  Ferritin  (Anemia Panel (PNL))  STAT,   STAT     01/18/18 0247      Vitals/Pain Today's Vitals   01/18/18 0428 01/18/18 0627 01/18/18 0703 01/18/18 0720  BP: 115/66 123/71 118/68 112/66  Pulse: 84 95 90 81  Resp: 18 (!) _0 Temp: 98.6 F (37 C)  99.1 F (37.3 C) 99.2 F (37.3 C)  TempSrc: Oral  Oral Oral  SpO2: 100% 100% 98% 99%  PainSc:        Isolation Precautions No active isolations  Medications Medications  sodium chloride flush (NS) 0.9 % injection 3 mL (has no administration in time range)  ondansetron (ZOFRAN) tablet 4 mg (has no administration in time range)    Or  ondansetron (ZOFRAN) injection 4 mg (has no administration in time range)  acetaminophen (TYLENOL) tablet 650 mg (has no administration in time range)    Or  acetaminophen (TYLENOL) suppository 650 mg (has no administration in time range)  albuterol (PROVENTIL) (2.5 MG/3ML) 0.083% nebulizer solution 2.5 mg (has no administration in time range)  megestrol (MEGACE) tablet 40 mg (has no administration in time range)  potassium chloride SA (K-DUR,KLOR-CON) CR tablet 20 mEq (has no administration in time range)  0.9 %  sodium chloride infusion ( Intravenous New Bag/Given 01/18/18 0703)  sodium chloride 0.9 % bolus 1,000 mL (0 mLs Intravenous Stopped 01/18/18 0342)    Mobility walks

## 2018-01-18 NOTE — H&P (Addendum)
History and Physical    Tasha Payne:[id number] DOB: 1971-01-07 DOA: 01/18/2018  Referring MD/NP/PA: Dr. Rolland Porter PCP: Patient, No Pcp Per  Patient coming from: home   Chief Complaint: Vaginal bleeding  I have personally briefly reviewed patient's old medical records in [place]   HPI: Tasha Payne is a 47 y.o. female without significant past medical history, who reports with complaints of heavy vaginal bleeding.  Symptoms started and January of this year with heavy bleeding during menstrual cycle, and then had no period in February.  For the last 3 weeks she reports having having bleeding with her menstrual cycle where she is saturating a full box of pads a day.  Notes passing blood clots as well.  She is been significantly fatigued, chills, and short of breath with exertion.  Denies any lightheadedness, chest pain, abdominal pain, nausea, vomiting, dysuria, NSAID use, blood thinner use, or history of clotting disorder.  Patient reports never having symptoms like this before in the past.  She does not take any iron supplementation.  Patient does not have a primary care doctor or OB/GYN with whom she sees.  ED Course: Patient into the emergency department patient was noted to have vital signs relatively within normal limits.  Labs revealed WBC 4.7, hemoglobin 3.8, platelet count 327, and potassium 3.4.  Patient was given 1 L normal saline IV fluids type and screen for possible blood products in order to be transfused 2 units of blood.  Transvaginal ultrasound showing large fibroid uterus with endometrial thickness of 5.3.  Dr. Ihor Dow OB/GYN was notified, but recommends admitting for blood transfusion, and work-up to be done as outpatient.  TRH consulted to admit.  Review of Systems  Constitutional: Positive for chills and malaise/fatigue. Negative for fever.  HENT: Negative for congestion and ear discharge.   Eyes: Negative for photophobia and discharge.  Respiratory:  Positive for shortness of breath. Negative for hemoptysis and wheezing.   Cardiovascular: Negative for palpitations and leg swelling.  Gastrointestinal: Negative for abdominal pain, blood in stool, nausea and vomiting.  Genitourinary: Negative for dysuria and urgency.       Positive for vaginal bleeding  Musculoskeletal: Negative for joint pain and neck pain.  Neurological: Negative for focal weakness and loss of consciousness.  Endo/Heme/Allergies: Bruises/bleeds easily.  Psychiatric/Behavioral: Negative for substance abuse.    History reviewed. No pertinent past medical history.  History reviewed. No pertinent surgical history.   reports that she has never smoked. She does not have any smokeless tobacco history on file. She reports that she drinks alcohol. She reports that she has current or past drug history. Drug: Marijuana.  No Known Allergies  History reviewed. No pertinent family history.  Prior to Admission medications   Medication Sig Start Date End Date Taking? Authorizing Provider  diphenhydrAMINE (BENADRYL) 25 MG tablet Take 1 tablet (25 mg total) by mouth every 6 (six) hours as needed. Patient not taking: Reported on 01/18/2018 03/13/14   Lajean Saver, MD    Physical Exam:  Constitutional: Middle-aged female who appears to be fatigued. Vitals:   01/18/18 0110 01/18/18 0428  BP: 126/69 115/66  Pulse: 83 84  Resp: 16 18  Temp: 97.9 F (36.6 C) 98.6 F (37 C)  TempSrc: Oral Oral  SpO2: 100% 100%   Eyes: PERRL, lids and conjunctivae normal ENMT: Mucous membranes are moist. Posterior pharynx clear of any exudate or lesions. .  Neck: normal, supple, no masses, no thyromegaly Respiratory: clear to auscultation bilaterally, no  wheezing, no crackles. Normal respiratory effort. No accessory muscle use.  Cardiovascular: Regular rate and rhythm, no murmurs / rubs / gallops. No extremity edema. 2+ pedal pulses. No carotid bruits.  Abdomen: no tenderness, no masses  palpated. No hepatosplenomegaly. Bowel sounds positive.  Musculoskeletal: no clubbing / cyanosis. No joint deformity upper and lower extremities. Good ROM, no contractures. Normal muscle tone.  Skin: Pallor present. no rashes, lesions, ulcers. No induration Neurologic: CN 2-12 grossly intact. Sensation intact, DTR normal. Strength 5/5 in all 4.  Psychiatric: Normal judgment and insight. Alert and oriented x 3. Normal mood.     Labs on Admission: I have personally reviewed following labs and imaging studies  CBC: Recent Labs  Lab 01/18/18 0122  WBC 4.7  HGB 3.8*  HCT 14.6*  MCV 57.3*  PLT 315   Basic Metabolic Panel: Recent Labs  Lab 01/18/18 0122  NA 140  K 3.4*  CL 109  CO2 22  GLUCOSE 140*  BUN 11  CREATININE 0.87  CALCIUM 9.6   GFR: CrCl cannot be calculated (Unknown ideal weight.). Liver Function Tests: No results for input(s): AST, ALT, ALKPHOS, BILITOT, PROT, ALBUMIN in the last 168 hours. No results for input(s): LIPASE, AMYLASE in the last 168 hours. No results for input(s): AMMONIA in the last 168 hours. Coagulation Profile: No results for input(s): INR, PROTIME in the last 168 hours. Cardiac Enzymes: No results for input(s): CKTOTAL, CKMB, CKMBINDEX, TROPONINI in the last 168 hours. BNP (last 3 results) No results for input(s): PROBNP in the last 8760 hours. HbA1C: No results for input(s): HGBA1C in the last 72 hours. CBG: No results for input(s): GLUCAP in the last 168 hours. Lipid Profile: No results for input(s): CHOL, HDL, LDLCALC, TRIG, CHOLHDL, LDLDIRECT in the last 72 hours. Thyroid Function Tests: No results for input(s): TSH, T4TOTAL, FREET4, T3FREE, THYROIDAB in the last 72 hours. Anemia Panel: Recent Labs    01/18/18 0434  RETICCTPCT 1.0   Urine analysis:    Component Value Date/Time   COLORURINE RED (A) 01/18/2018 0213   APPEARANCEUR CLOUDY (A) 01/18/2018 0213   LABSPEC 1.020 01/18/2018 0213   PHURINE 7.5 01/18/2018 0213    GLUCOSEU NEGATIVE 01/18/2018 0213   HGBUR LARGE (A) 01/18/2018 0213   BILIRUBINUR NEGATIVE 01/18/2018 0213   KETONESUR NEGATIVE 01/18/2018 0213   PROTEINUR >300 (A) 01/18/2018 0213   UROBILINOGEN 0.2 11/30/2012 1828   NITRITE NEGATIVE 01/18/2018 0213   LEUKOCYTESUR TRACE (A) 01/18/2018 0213   Sepsis Labs: No results found for this or any previous visit (from the past 240 hour(s)).   Radiological Exams on Admission: US Pelvic Complete With Transvaginal  Result Date: 01/18/2018 CLINICAL DATA:  Vaginal bleeding for 3 weeks EXAM: TRANSABDOMINAL AND TRANSVAGINAL ULTRASOUND OF PELVIS TECHNIQUE: Both transabdominal and transvaginal ultrasound examinations of the pelvis were performed. Transabdominal technique was performed for global imaging of the pelvis including uterus, ovaries, adnexal regions, and pelvic cul-de-sac. It was necessary to proceed with endovaginal exam following the transabdominal exam to visualize the ovaries. COMPARISON:  None FINDINGS: Uterus Measurements: 11.3 x 6.2 x 6.6 cm. Multiple prominent fibroids. Left lateral pedunculated fibroid measures 3.8 x 2.9 x 3.5 cm. Left submucosal fibroid measures 2.9 x 2.9 x 2.8 cm. Fundal fibroid measures 2.8 x 2.9 x 3.2 cm. Endometrium Thickness: 5.3 mm.  No focal abnormality visualized. Right ovary Measurements: 3 x 1.3 x 1.4 cm.  Normal appearance/no adnexal mass. Left ovary Not seen Other findings No abnormal free fluid. IMPRESSION: 1. Enlarged fibroid uterus 2.  Endometrial thickness of 5.3 mm. If bleeding remains unresponsive to hormonal or medical therapy, sonohysterogram should be considered for focal lesion work-up. (Ref: Radiological Reasoning: Algorithmic Workup of Abnormal Vaginal Bleeding with Endovaginal Sonography and Sonohysterography. AJR 2008; 154:M08-67) 3. Nonvisualized left ovary Electronically Signed   By: Donavan Foil M.D.   On: 01/18/2018 04:02      Assessment/Plan Symptomatic anemia 2/2 menorrhagia with irregular cycle,  uterine fibroid: Patient reports having significant vaginal bleeding over the last 3 to 4 weeks.  Initial hemoglobin noted to be 3.8 with low MCV and MCH.  Vital signs otherwise noted to be stable.  Ultrasound revealing uterine fibroid. Discussed case with Dr. Purvis Kilts of OB/GYN to clarify recommendations.  Recommends transfusion of blood, begin Megace 40 mg twice daily to help stop bleeding, recommend supplying patient with prescription at discharge to last patient at least until outpatient work-up appointment. - Admit to a telemetry - Follow-up TIBC and iron level  - Add on PT/INR and PTT  - Change transfusion to 3 units of PRBCs - Recheck CBC after transfusion complete, and transfuse as needed - Megace 40 mg twice daily - Message already sent to Dr. Ihor Dow regarding patient to set her up with a outpatient appointment for further work-up.  DVT prophylaxis: scds  Code Status: Full  Family Communication: None Disposition Plan: Likely discharge home once medically stable Consults called: OB/GYN Admission status: Observation  Norval Morton MD [facility] Pager [phone]   If 7PM-7AM, please contact night-coverage [email or web address] Password [facility]  01/18/2018, 5:38 AM

## 2018-01-18 NOTE — Telephone Encounter (Signed)
Called and notified patient of appt scheduled for 01/26/18 at 5:00pm.  Patient voiced understanding.

## 2018-01-18 NOTE — ED Provider Notes (Addendum)
[facility] DEPT Provider Note   CSN: [id number] Arrival date & time: 01/17/18  2230  Time seen 02:13 AM   History   Chief Complaint Chief Complaint  Patient presents with  . Vaginal Bleeding    HPI Tasha Payne is a 47 y.o. female.  HPI   patient states she started having a change in her periods in January.  She states they were getting heavier and lasting longer.  She states currently the.  She is on has been there for 3-1/2 to 4 weeks.  She states she is having clots and the bleeding is much heavier than a regular period.  She denies any abdominal pain.  She states she has been very tired lately and she starting to have dyspnea on exertion.  She denies any chest pain, lightheadedness, or dizziness.  She states she came in tonight because she was just tired of bleeding.  PCP none GYN none   History reviewed. No pertinent past medical history.  There are no active problems to display for this patient.   History reviewed. No pertinent surgical history.   OB History   None      Home Medications    None  Prior to Admission medications   Medication Sig Start Date End Date Taking? Authorizing Provider  diphenhydrAMINE (BENADRYL) 25 MG tablet Take 1 tablet (25 mg total) by mouth every 6 (six) hours as needed. Patient not taking: Reported on 01/18/2018 03/13/14   Lajean Saver, MD    Family History History reviewed. No pertinent family history.  Social History Social History   Tobacco Use  . Smoking status: Never Smoker  Substance Use Topics  . Alcohol use: Yes    Comment: occasionally  . Drug use: Yes    Types: Marijuana    Comment: occasionally     Allergies   Patient has no known allergies.   Review of Systems Review of Systems  All other systems reviewed and are negative.    Physical Exam Updated Vital Signs BP 115/66   Pulse 84   Temp 98.6 F (37 C) (Oral)   Resp 18   SpO2 100%   Physical Exam    Constitutional: She is oriented to person, place, and time. She appears well-developed and well-nourished.  Non-toxic appearance. She does not appear ill. No distress.  HENT:  Head: Normocephalic and atraumatic.  Right Ear: External ear normal.  Left Ear: External ear normal.  Nose: Nose normal. No mucosal edema or rhinorrhea.  Mouth/Throat: Oropharynx is clear and moist and mucous membranes are normal. No dental abscesses or uvula swelling.  Pale mucous membranes  Eyes: Pupils are equal, round, and reactive to light. Conjunctivae and EOM are normal.  Pale conjunctiva  Neck: Normal range of motion and full passive range of motion without pain. Neck supple.  Cardiovascular: Normal rate, regular rhythm and normal heart sounds. Exam reveals no gallop and no friction rub.  No murmur heard. Pulmonary/Chest: Effort normal and breath sounds normal. No respiratory distress. She has no wheezes. She has no rhonchi. She has no rales. She exhibits no tenderness and no crepitus.  Abdominal: Soft. Normal appearance and bowel sounds are normal. She exhibits no distension. There is no tenderness. There is no rebound and no guarding.  Musculoskeletal: Normal range of motion. She exhibits no edema or tenderness.  Moves all extremities well.   Neurological: She is alert and oriented to person, place, and time. She has normal strength. No cranial nerve deficit.  Skin: Skin is warm, dry and intact. No rash noted. No erythema. There is pallor.  Psychiatric: She has a normal mood and affect. Her speech is normal and behavior is normal. Her mood appears not anxious.  Nursing note and vitals reviewed.    ED Treatments / Results  Labs (all labs ordered are listed, but only abnormal results are displayed) Results for orders placed or performed during the hospital encounter of 21/30/86  Basic metabolic panel  Result Value Ref Range   Sodium 140 135 - 145 mmol/L   Potassium 3.4 (L) 3.5 - 5.1 mmol/L   Chloride  109 101 - 111 mmol/L   CO2 22 22 - 32 mmol/L   Glucose, Bld 140 (H) 65 - 99 mg/dL   BUN 11 6 - 20 mg/dL   Creatinine, Ser 0.87 0.44 - 1.00 mg/dL   Calcium 9.6 8.9 - 10.3 mg/dL   GFR calc non Af Amer >60 >60 mL/min   GFR calc Af Amer >60 >60 mL/min   Anion gap 9 5 - 15  CBC  Result Value Ref Range   WBC 4.7 4.0 - 10.5 K/uL   RBC 2.55 (L) 3.87 - 5.11 MIL/uL   Hemoglobin 3.8 (LL) 12.0 - 15.0 g/dL   HCT 14.6 (L) 36.0 - 46.0 %   MCV 57.3 (L) 78.0 - 100.0 fL   MCH 14.9 (L) 26.0 - 34.0 pg   MCHC 26.0 (L) 30.0 - 36.0 g/dL   RDW 19.7 (H) 11.5 - 15.5 %   Platelets 327 150 - 400 K/uL  Urinalysis, Routine w reflex microscopic  Result Value Ref Range   Color, Urine RED (A) YELLOW   APPearance CLOUDY (A) CLEAR   Specific Gravity, Urine 1.020 1.005 - 1.030   pH 7.5 5.0 - 8.0   Glucose, UA NEGATIVE NEGATIVE mg/dL   Hgb urine dipstick LARGE (A) NEGATIVE   Bilirubin Urine NEGATIVE NEGATIVE   Ketones, ur NEGATIVE NEGATIVE mg/dL   Protein, ur >300 (A) NEGATIVE mg/dL   Nitrite NEGATIVE NEGATIVE   Leukocytes, UA TRACE (A) NEGATIVE  Reticulocytes  Result Value Ref Range   Retic Ct Pct 1.0 0.4 - 3.1 %   RBC. 1.95 (L) 3.87 - 5.11 MIL/uL   Retic Count, Absolute 19.5 19.0 - 186.0 K/uL  Urinalysis, Microscopic (reflex)  Result Value Ref Range   RBC / HPF >50 0 - 5 RBC/hpf   WBC, UA 0-5 0 - 5 WBC/hpf   Bacteria, UA RARE (A) NONE SEEN   Squamous Epithelial / LPF NONE SEEN 0 - 5   Amorphous Crystal PRESENT   I-Stat beta hCG blood, ED  Result Value Ref Range   I-stat hCG, quantitative <5.0 <5 mIU/mL   Comment 3          Type and screen [place]  Result Value Ref Range   ABO/RH(D) A POS    Antibody Screen NEG    Sample Expiration 01/21/2018    Unit Number [id number]    Blood Component Type RED CELLS,LR    Unit division 00    Status of Unit ALLOCATED    Transfusion Status OK TO TRANSFUSE    Crossmatch Result      Compatible Performed at [facility], [place] [address], [place], [place] [address]    Unit Number [id number]    Blood Component Type RED CELLS,LR    Unit division 00    Status of Unit ALLOCATED    Transfusion Status OK TO TRANSFUSE  Crossmatch Result Compatible   Prepare RBC  Result Value Ref Range   Order Confirmation      ORDER PROCESSED BY BLOOD BANK Performed at [facility] [address] [place], [place] [address]   ABO/Rh  Result Value Ref Range   ABO/RH(D)      A POS Performed at [facility], [place] [address] [place], [place] [address]   BPAM [facility]  Result Value Ref Range   Blood Product Unit Number [id number]    Unit Type and Rh 6200    Blood Product Expiration Date [id number]    Blood Product Unit Number [id number]    Unit Type and Rh 6200    Blood Product Expiration Date [id number]     Laboratory interpretation all normal except anemia with indices consistent with iron deficiency anemia   EKG None  Radiology US Pelvic Complete With Transvaginal  Result Date: 01/18/2018 CLINICAL DATA:  Vaginal bleeding for 3 weeks EXAM: TRANSABDOMINAL AND TRANSVAGINAL ULTRASOUND OF PELVIS TECHNIQUE: Both transabdominal and transvaginal ultrasound examinations of the pelvis were performed. Transabdominal technique was performed for global imaging of the pelvis including uterus, ovaries, adnexal regions, and pelvic cul-de-sac. It was necessary to proceed with endovaginal exam following the transabdominal exam to visualize the ovaries. COMPARISON:  None FINDINGS: Uterus Measurements: 11.3 x 6.2 x 6.6 cm. Multiple prominent fibroids. Left lateral pedunculated fibroid measures 3.8 x 2.9 x 3.5 cm. Left submucosal fibroid measures 2.9 x 2.9 x 2.8 cm. Fundal fibroid measures 2.8 x 2.9 x 3.2 cm. Endometrium Thickness: 5.3 mm.  No focal abnormality visualized. Right ovary Measurements: 3 x 1.3 x 1.4 cm.  Normal appearance/no adnexal mass. Left ovary Not seen Other  findings No abnormal free fluid. IMPRESSION: 1. Enlarged fibroid uterus 2. Endometrial thickness of 5.3 mm. If bleeding remains unresponsive to hormonal or medical therapy, sonohysterogram should be considered for focal lesion work-up. (Ref: Radiological Reasoning: Algorithmic Workup of Abnormal Vaginal Bleeding with Endovaginal Sonography and Sonohysterography. AJR 2008; 962:I29-79) 3. Nonvisualized left ovary Electronically Signed   By: Donavan Foil M.D.   On: 01/18/2018 04:02    Procedures .Critical Care Performed by: Rolland Porter, MD Authorized by: Rolland Porter, MD   Critical care provider statement:    Critical care time (minutes):  33   Critical care was necessary to treat or prevent imminent or life-threatening deterioration of the following conditions:  Circulatory failure   Critical care was time spent personally by me on the following activities:  Discussions with consultants, examination of patient, obtaining history from patient or surrogate, ordering and review of laboratory studies, ordering and review of radiographic studies, pulse oximetry and re-evaluation of patient's condition   (including critical care time)  Medications Ordered in ED Medications  0.9 %  sodium chloride infusion (has no administration in time range)  sodium chloride 0.9 % bolus 1,000 mL (1,000 mLs Intravenous New Bag/Given 01/18/18 0241)     Initial Impression / Assessment and Plan / ED Course  I have reviewed the triage vital signs and the nursing notes.  Pertinent labs & imaging results that were available during my care of the patient were reviewed by me and considered in my medical decision making (see chart for details).     Patient was given IV fluids.  Was typed and crossed for 2 units of blood.  Ultrasound the pelvis was done, we discussed that the most likely etiology for her problem is going to be fibroids.  She states that that is  common in other female members of her family.  I will also talk  to GYN at [facility] once I get her pelvic ultrasound results.  05:16 AM Dr Ihor Dow states to see if medicine will admit, she will arrange outpatient evaluation, if they won't admit, she can be admitted there under her name to the 3rd floor.   05:47 AM Dr Harvest Forest, hospitalist, will admit  06:20 AM discussed her ultrasound results with patient, she has not started getting her blood transfusion yet.  It is not clear to me why it has not started yet.  Final Clinical Impressions(s) / ED Diagnoses   Final diagnoses:  Menorrhagia  Anemia    Plan admission  Rolland Porter, MD, Barbette Or, MD 01/18/18 2574    Rolland Porter, MD 01/25/18 [id number]

## 2018-01-18 NOTE — ED Notes (Signed)
Bed: WA17 Expected date:  Expected time:  Means of arrival:  Comments: Held for Room 5

## 2018-01-19 DIAGNOSIS — D500 Iron deficiency anemia secondary to blood loss (chronic): Secondary | ICD-10-CM

## 2018-01-19 DIAGNOSIS — D649 Anemia, unspecified: Secondary | ICD-10-CM

## 2018-01-19 LAB — HEMOGLOBIN AND HEMATOCRIT, BLOOD
HCT: 31.5 % — ABNORMAL LOW (ref 36.0–46.0)
Hemoglobin: 10 g/dL — ABNORMAL LOW (ref 12.0–15.0)

## 2018-01-19 LAB — CBC
HEMATOCRIT: 22 % — AB (ref 36.0–46.0)
HEMOGLOBIN: 6.8 g/dL — AB (ref 12.0–15.0)
MCH: 21.5 pg — AB (ref 26.0–34.0)
MCHC: 30.9 g/dL (ref 30.0–36.0)
MCV: 69.4 fL — ABNORMAL LOW (ref 78.0–100.0)
Platelets: 209 10*3/uL (ref 150–400)
RBC: 3.17 MIL/uL — ABNORMAL LOW (ref 3.87–5.11)
RDW: 27.8 % — AB (ref 11.5–15.5)
WBC: 3.8 10*3/uL — ABNORMAL LOW (ref 4.0–10.5)

## 2018-01-19 LAB — BASIC METABOLIC PANEL
Anion gap: 5 (ref 5–15)
BUN: 6 mg/dL (ref 6–20)
CALCIUM: 8.4 mg/dL — AB (ref 8.9–10.3)
CHLORIDE: 115 mmol/L — AB (ref 101–111)
CO2: 21 mmol/L — AB (ref 22–32)
Creatinine, Ser: 0.66 mg/dL (ref 0.44–1.00)
GFR calc Af Amer: >60 mL/min (ref >60)
GFR calc non Af Amer: >60 mL/min (ref >60)
Glucose, Bld: 95 mg/dL (ref 65–99)
Potassium: 3.7 mmol/L (ref 3.5–5.1)
Sodium: 141 mmol/L (ref 135–145)

## 2018-01-19 LAB — PREPARE RBC (CROSSMATCH)

## 2018-01-19 MED ORDER — SODIUM CHLORIDE 0.9 % IV SOLN
Freq: Once | INTRAVENOUS | Status: AC
  Administered 2018-01-19: 08:00:00 via INTRAVENOUS

## 2018-01-19 MED ORDER — FERUMOXYTOL INJECTION 510 MG/17 ML
510.0000 mg | Freq: Once | INTRAVENOUS | Status: AC
  Administered 2018-01-19: 510 mg via INTRAVENOUS
  Filled 2018-01-19: qty 17

## 2018-01-19 MED ORDER — FERROUS SULFATE 325 (65 FE) MG PO TABS: 325.0000 mg | ORAL_TABLET | Freq: Two times a day (BID) | ORAL | 0 refills | Status: DC

## 2018-01-19 MED ORDER — MEGESTROL ACETATE 40 MG PO TABS: 40.0000 mg | ORAL_TABLET | Freq: Two times a day (BID) | ORAL | 0 refills | Status: AC

## 2018-01-19 NOTE — Progress Notes (Signed)
Patient has discharged to home on 01/19/18. Discharge instruction including medication and appointment was given to patient. Patient has no question at this time.

## 2018-01-19 NOTE — Discharge Summary (Signed)
Physician Discharge Summary  Tasha Payne QZE:[id number] DOB: 1971/08/19 DOA: 01/18/2018  PCP: Patient, No Pcp Per  Admit date: 01/18/2018 Discharge date: 01/19/2018  Admitted From: Home Disposition:  Home  Recommendations for Outpatient Follow-up:  1. Follow up with Gyn at 5/21 as scheduled  2. Please obtain CBC in 1 week   Discharge Condition: Stable CODE STATUS: Full Diet recommendation: Regular   Brief/Interim Summary: Tasha Payne a 47 y.o.femalewithout significant past medical history, who reports with complaints of heavy vaginal bleeding. Symptoms started and January of this year with heavy bleeding during menstrual cycle, and then had no period in February. For the last 3 weeks she reports having having bleeding with her menstrual cycle where she is saturating a full box of pads a day. Notes passing blood clots as well. She is been significantly fatigued, chills, and short of breath with exertion. Denies any lightheadedness, chest pain, abdominal pain, nausea, vomiting, dysuria, NSAID use, blood thinner use, or history of clotting disorder. In the ED, Hgb was 3.8. Blood transfusions were ordered. Transvaginal US revealed large fibroid uterus with endometrial thickness of 5.59mm. Dr. Glendon Axe was consulted who recommend outpatient follow up for work up, medical treatment for anemia, and to begin Megace 40mg  BID. She received total of 5u pRBC transfusion as well as IV iron with stabilization of Hgb. She will follow up with gynecology as outpatient.   Discharge Diagnoses:  Principal Problem:   Symptomatic anemia Active Problems:   Menorrhagia   Uterine fibroid   Iron deficiency anemia due to chronic blood loss   Discharge Instructions  Discharge Instructions    Call MD for:  difficulty breathing, headache or visual disturbances   Complete by:  As directed    Call MD for:  extreme fatigue   Complete by:  As directed    Call MD for:  persistant dizziness or  light-headedness   Complete by:  As directed    Call MD for:  persistant nausea and vomiting   Complete by:  As directed    Call MD for:  severe uncontrolled pain   Complete by:  As directed    Call MD for:  temperature >100.4   Complete by:  As directed    Diet general   Complete by:  As directed    Discharge instructions   Complete by:  As directed    You were cared for by a hospitalist during your hospital stay. If you have any questions about your discharge medications or the care you received while you were in the hospital after you are discharged, you can call the unit and ask to speak with the hospitalist on call if the hospitalist that took care of you is not available. Once you are discharged, your primary care physician will handle any further medical issues. Please note that NO REFILLS for any discharge medications will be authorized once you are discharged, as it is imperative that you return to your primary care physician (or establish a relationship with a primary care physician if you do not have one) for your aftercare needs so that they can reassess your need for medications and monitor your lab values.   Increase activity slowly   Complete by:  As directed      Allergies as of 01/19/2018   No Known Allergies     Medication List    STOP taking these medications   diphenhydrAMINE 25 MG tablet Commonly known as:  BENADRYL     TAKE these medications  ferrous sulfate 325 (65 FE) MG tablet Commonly known as:  IRON SUPPLEMENT Take 1 tablet (325 mg total) by mouth 2 (two) times daily with a meal.   megestrol 40 MG tablet Commonly known as:  MEGACE Take 1 tablet (40 mg total) by mouth 2 (two) times daily.       No Known Allergies  Consultations:  None    Procedures/Studies: US Pelvic Complete With Transvaginal  Result Date: 01/18/2018 CLINICAL DATA:  Vaginal bleeding for 3 weeks EXAM: TRANSABDOMINAL AND TRANSVAGINAL ULTRASOUND OF PELVIS TECHNIQUE: Both  transabdominal and transvaginal ultrasound examinations of the pelvis were performed. Transabdominal technique was performed for global imaging of the pelvis including uterus, ovaries, adnexal regions, and pelvic cul-de-sac. It was necessary to proceed with endovaginal exam following the transabdominal exam to visualize the ovaries. COMPARISON:  None FINDINGS: Uterus Measurements: 11.3 x 6.2 x 6.6 cm. Multiple prominent fibroids. Left lateral pedunculated fibroid measures 3.8 x 2.9 x 3.5 cm. Left submucosal fibroid measures 2.9 x 2.9 x 2.8 cm. Fundal fibroid measures 2.8 x 2.9 x 3.2 cm. Endometrium Thickness: 5.3 mm.  No focal abnormality visualized. Right ovary Measurements: 3 x 1.3 x 1.4 cm.  Normal appearance/no adnexal mass. Left ovary Not seen Other findings No abnormal free fluid. IMPRESSION: 1. Enlarged fibroid uterus 2. Endometrial thickness of 5.3 mm. If bleeding remains unresponsive to hormonal or medical therapy, sonohysterogram should be considered for focal lesion work-up. (Ref: Radiological Reasoning: Algorithmic Workup of Abnormal Vaginal Bleeding with Endovaginal Sonography and Sonohysterography. AJR 2008; 175:Z02-58) 3. Nonvisualized left ovary Electronically Signed   By: Donavan Foil M.D.   On: 01/18/2018 04:02     Discharge Exam: Vitals:   01/19/18 1326 01/19/18 1439  BP: (!) 141/79 136/90  Pulse: (!) 58 65  Resp: 16 14  Temp: 99 F (37.2 C) 98.6 F (37 C)  SpO2: 99% 100%    General: Pt is alert, awake, not in acute distress Cardiovascular: RRR, S1/S2 +, no rubs, no gallops Respiratory: CTA bilaterally, no wheezing, no rhonchi Abdominal: Soft, NT, ND, bowel sounds + Extremities: no edema, no cyanosis    The results of significant diagnostics from this hospitalization (including imaging, microbiology, ancillary and laboratory) are listed below for reference.     Microbiology: No results found for this or any previous visit (from the past 240 hour(s)).   Labs: BNP  (last 3 results) No results for input(s): BNP in the last 8760 hours. Basic Metabolic Panel: Recent Labs  Lab 01/18/18 0122 01/19/18 0625  NA 140 141  K 3.4* 3.7  CL 109 115*  CO2 22 21*  GLUCOSE 140* 95  BUN 11 6  CREATININE 0.87 0.66  CALCIUM 9.6 8.4*   Liver Function Tests: No results for input(s): AST, ALT, ALKPHOS, BILITOT, PROT, ALBUMIN in the last 168 hours. No results for input(s): LIPASE, AMYLASE in the last 168 hours. No results for input(s): AMMONIA in the last 168 hours. CBC: Recent Labs  Lab 01/18/18 0122 01/18/18 1617 01/19/18 0625 01/19/18 1524  WBC 4.7  --  3.8*  --   HGB 3.8* 7.1* 6.8* 10.0*  HCT 14.6* 22.9* 22.0* 31.5*  MCV 57.3*  --  69.4*  --   PLT 327  --  209  --    Cardiac Enzymes: No results for input(s): CKTOTAL, CKMB, CKMBINDEX, TROPONINI in the last 168 hours. BNP: Invalid input(s): POCBNP CBG: No results for input(s): GLUCAP in the last 168 hours. D-Dimer No results for input(s): DDIMER in the last 72  hours. Hgb A1c No results for input(s): HGBA1C in the last 72 hours. Lipid Profile No results for input(s): CHOL, HDL, LDLCALC, TRIG, CHOLHDL, LDLDIRECT in the last 72 hours. Thyroid function studies No results for input(s): TSH, T4TOTAL, T3FREE, THYROIDAB in the last 72 hours.  Invalid input(s): FREET3 Anemia work up Recent Labs    01/18/18 0434  VITAMINB12 845  FOLATE 10.1  FERRITIN 1*  TIBC 386  IRON 6*  RETICCTPCT 1.0   Urinalysis    Component Value Date/Time   COLORURINE RED (A) 01/18/2018 0213   APPEARANCEUR CLOUDY (A) 01/18/2018 0213   LABSPEC 1.020 01/18/2018 0213   PHURINE 7.5 01/18/2018 0213   GLUCOSEU NEGATIVE 01/18/2018 0213   HGBUR LARGE (A) 01/18/2018 0213   BILIRUBINUR NEGATIVE 01/18/2018 0213   KETONESUR NEGATIVE 01/18/2018 0213   PROTEINUR >300 (A) 01/18/2018 0213   UROBILINOGEN 0.2 11/30/2012 1828   NITRITE NEGATIVE 01/18/2018 0213   LEUKOCYTESUR TRACE (A) 01/18/2018 0213   Sepsis Labs Invalid  input(s): PROCALCITONIN,  WBC,  LACTICIDVEN Microbiology No results found for this or any previous visit (from the past 240 hour(s)).   Patient was seen and examined on the day of discharge and was found to be in stable condition. Time coordinating discharge: 25 minutes including assessment and coordination of care, as well as examination of the patient.   SIGNED:  Dessa Phi, DO [facility] Pager [phone]  If 7PM-7AM, please contact night-coverage [email or web address] Password [facility] 01/19/2018, 6:04 PM

## 2018-01-20 LAB — TYPE AND SCREEN
ABO/RH(D): A POS
Antibody Screen: NEGATIVE
UNIT DIVISION: 0
UNIT DIVISION: 0
UNIT DIVISION: 0
Unit division: 0
Unit division: 0

## 2018-01-20 LAB — BPAM RBC
BLOOD PRODUCT EXPIRATION DATE: 201906022359
BLOOD PRODUCT EXPIRATION DATE: 201906022359
Blood Product Expiration Date: 201906012359
Blood Product Expiration Date: 201906012359
Blood Product Expiration Date: 201906022359
ISSUE DATE / TIME: 201905130649
ISSUE DATE / TIME: 201905140833
ISSUE DATE / TIME: 201905130948
ISSUE DATE / TIME: 201905131222
ISSUE DATE / TIME: 201905141232
UNIT TYPE AND RH: 6200
UNIT TYPE AND RH: 6200
Unit Type and Rh: 6200
Unit Type and Rh: 6200
Unit Type and Rh: 6200

## 2018-01-26 ENCOUNTER — Encounter: Payer: Self-pay | Admitting: Obstetrics and Gynecology

## 2018-01-26 ENCOUNTER — Ambulatory Visit (INDEPENDENT_AMBULATORY_CARE_PROVIDER_SITE_OTHER): Payer: Self-pay | Admitting: Obstetrics and Gynecology

## 2018-01-26 VITALS — BP 124/61 | HR 70 | Ht 64.0 in | Wt 134.6 lb

## 2018-01-26 DIAGNOSIS — N921 Excessive and frequent menstruation with irregular cycle: Secondary | ICD-10-CM

## 2018-01-26 DIAGNOSIS — D500 Iron deficiency anemia secondary to blood loss (chronic): Secondary | ICD-10-CM

## 2018-01-26 DIAGNOSIS — D259 Leiomyoma of uterus, unspecified: Secondary | ICD-10-CM

## 2018-01-26 NOTE — Patient Instructions (Addendum)
Uterine Fibroids Uterine fibroids are tissue masses (tumors) that can develop in the womb (uterus). They are also called leiomyomas. This type of tumor is not cancerous (benign) and does not spread to other parts of the body outside of the pelvic area, which is between the hip bones. Occasionally, fibroids may develop in the fallopian tubes, in the cervix, or on the support structures (ligaments) that surround the uterus. You can have one or many fibroids. Fibroids can vary in size, weight, and where they grow in the uterus. Some can become quite large. Most fibroids do not require medical treatment. What are the causes? A fibroid can develop when a single uterine cell keeps growing (replicating). Most cells in the human body have a control mechanism that keeps them from replicating without control. What are the signs or symptoms? Symptoms may include:  Heavy bleeding during your period.  Bleeding or spotting between periods.  Pelvic pain and pressure.  Bladder problems, such as needing to urinate more often (urinary frequency) or urgently.  Inability to reproduce offspring (infertility).  Miscarriages.  How is this diagnosed? Uterine fibroids are diagnosed through a physical exam. Your health care provider may feel the lumpy tumors during a pelvic exam. Ultrasonography and an MRI may be done to determine the size, location, and number of fibroids. How is this treated? Treatment may include:  Watchful waiting. This involves getting the fibroid checked by your health care provider to see if it grows or shrinks. Follow your health care provider's recommendations for how often to have this checked.  Hormone medicines. These can be taken by mouth or given through an intrauterine device (IUD).  Surgery. ? Removing the fibroids (myomectomy) or the uterus (hysterectomy). ? Removing blood supply to the fibroids (uterine artery embolization).  If fibroids interfere with your fertility and you  want to become pregnant, your health care provider may recommend having the fibroids removed. Follow these instructions at home:  Keep all follow-up visits as directed by your health care provider. This is important.  Take over-the-counter and prescription medicines only as told by your health care provider. ? If you were prescribed a hormone treatment, take the hormone medicines exactly as directed.  Ask your health care provider about taking iron pills and increasing the amount of dark green, leafy vegetables in your diet. These actions can help to boost your blood iron levels, which may be affected by heavy menstrual bleeding.  Pay close attention to your period and tell your health care provider about any changes, such as: ? Increased blood flow that requires you to use more pads or tampons than usual per month. ? A change in the number of days that your period lasts per month. ? A change in symptoms that are associated with your period, such as abdominal cramping or back pain. Contact a health care provider if:  You have pelvic pain, back pain, or abdominal cramps that cannot be controlled with medicines.  You have an increase in bleeding between and during periods.  You soak tampons or pads in a half hour or less.  You feel lightheaded, extra tired, or weak. Get help right away if:  You faint.  You have a sudden increase in pelvic pain. This information is not intended to replace advice given to you by your health care provider. Make sure you discuss any questions you have with your health care provider. Document Released: 08/22/2000 Document Revised: 04/24/2016 Document Reviewed: 02/21/2014 Elsevier Interactive Patient Education  [employer].  Endometrial Ablation Endometrial ablation is a procedure that destroys the thin inner layer of the lining of the uterus (endometrium). This procedure may be done:  To stop heavy periods.  To stop bleeding that is causing  anemia.  To control irregular bleeding.  To treat bleeding caused by small tumors (fibroids) in the endometrium.  This procedure is often an alternative to major surgery, such as removal of the uterus and cervix (hysterectomy). As a result of this procedure:  You may not be able to have children. However, if you are premenopausal (you have not gone through menopause): ? You may still have a small chance of getting pregnant. ? You will need to use a reliable method of birth control after the procedure to prevent pregnancy.  You may stop having a menstrual period, or you may have only a small amount of bleeding during your period. Menstruation may return several years after the procedure.  Tell a health care provider about:  Any allergies you have.  All medicines you are taking, including vitamins, herbs, eye drops, creams, and over-the-counter medicines.  Any problems you or family members have had with the use of anesthetic medicines.  Any blood disorders you have.  Any surgeries you have had.  Any medical conditions you have. What are the risks? Generally, this is a safe procedure. However, problems may occur, including:  A hole (perforation) in the uterus or bowel.  Infection of the uterus, bladder, or vagina.  Bleeding.  Damage to other structures or organs.  An air bubble in the lung (air embolus).  Problems with pregnancy after the procedure.  Failure of the procedure.  Decreased ability to diagnose cancer in the endometrium.  What happens before the procedure?  You will have tests of your endometrium to make sure there are no pre-cancerous cells or cancer cells present.  You may have an ultrasound of the uterus.  You may be given medicines to thin the endometrium.  Ask your health care provider about: ? Changing or stopping your regular medicines. This is especially important if you take diabetes medicines or blood thinners. ? Taking medicines such as  aspirin and ibuprofen. These medicines can thin your blood. Do not take these medicines before your procedure if your doctor tells you not to.  Plan to have someone take you home from the hospital or clinic. What happens during the procedure?  You will lie on an exam table with your feet and legs supported as in a pelvic exam.  To lower your risk of infection: ? Your health care team will wash or sanitize their hands and put on germ-free (sterile) gloves. ? Your genital area will be washed with soap.  An IV tube will be inserted into one of your veins.  You will be given a medicine to help you relax (sedative).  A surgical instrument with a light and camera (resectoscope) will be inserted into your vagina and moved into your uterus. This allows your surgeon to see inside your uterus.  Endometrial tissue will be removed using one of the following methods: ? Radiofrequency. This method uses a radiofrequency-alternating electric current to remove the endometrium. ? Cryotherapy. This method uses extreme cold to freeze the endometrium. ? Heated-free liquid. This method uses a heated saltwater (saline) solution to remove the endometrium. ? Microwave. This method uses high-energy microwaves to heat up the endometrium and remove it. ? Thermal balloon. This method involves inserting a catheter with a balloon tip into the uterus. The balloon tip  is filled with heated fluid to remove the endometrium. The procedure may vary among health care providers and hospitals. What happens after the procedure?  Your blood pressure, heart rate, breathing rate, and blood oxygen level will be monitored until the medicines you were given have worn off.  As tissue healing occurs, you may notice vaginal bleeding for 4-6 weeks after the procedure. You may also experience: ? Cramps. ? Thin, watery vaginal discharge that is light pink or brown in color. ? A need to urinate more frequently than usual. ? Nausea.  Do  not drive for 24 hours if you were given a sedative.  Do not have sex or insert anything into your vagina until your health care provider approves. Summary  Endometrial ablation is done to treat the many causes of heavy menstrual bleeding.  The procedure may be done only after medications have been tried to control the bleeding.  Plan to have someone take you home from the hospital or clinic. This information is not intended to replace advice given to you by your health care provider. Make sure you discuss any questions you have with your health care provider. Document Released: 07/04/2004 Document Revised: 09/11/2016 Document Reviewed: 09/11/2016 Elsevier Interactive Patient Education  2017 [employer].

## 2018-01-26 NOTE — Progress Notes (Signed)
GYNECOLOGY OFFICE VISIT NOTE  Chief Complaint  Patient presents with  . Vaginal Bleeding   History:  47 y.o. [id number] here today for vaginal bleeding. Patient states that she went to the ED for heavy vaginal bleeding on 01/17/18. Patient states in the month of April she bled for 3 and half weeks.  States that she has been having irregular periods starting this January.  States that in January she bled for 20 days, February no bleeding, and March had her normal bleeding. Patient states prior to January she had regular 5 to 7-day heavy periods.  When she went to the ED found to have hemoglobin of 3.8 was given 5 units of blood and admitted.  Hemoglobin at discharge up to 10.  Patient had ultrasound which showed that she had multiple fibroids.  Patient has a strong family history for fibroids.  Patient was given Megace on discharge which she has been taking.  Denies any further bleeding. She denies any abnormal vaginal discharge, pelvic pain or other concerns.   Past Medical History:  Diagnosis Date  . Medical history non-contributory     Past Surgical History:  Procedure Laterality Date  . NO PAST SURGERIES     Current Outpatient Medications on File Prior to Visit  Medication Sig Dispense Refill  . ferrous sulfate (IRON SUPPLEMENT) 325 (65 FE) MG tablet Take 1 tablet (325 mg total) by mouth 2 (two) times daily with a meal. 60 tablet 0  . megestrol (MEGACE) 40 MG tablet Take 1 tablet (40 mg total) by mouth 2 (two) times daily. 60 tablet 0   No current facility-administered medications on file prior to visit.     The following portions of the patient's history were reviewed and updated as appropriate: allergies, current medications, past family history, past medical history, past social history, past surgical history and problem list.   Review of Systems:  Pertinent items noted in HPI and remainder of comprehensive ROS otherwise negative.   Objective:  Physical Exam BP 124/61   Pulse  70   Ht 5\' 4"  (1.626 m)   Wt 134 lb 9.6 oz (61.1 kg)   LMP 01/18/2018 (Exact Date)   BMI 23.10 kg/m  CONSTITUTIONAL: Well-developed, well-nourished female in no acute distress.  HENT:  Normocephalic, atraumatic. External right and left ear normal. EYES: Conjunctivae and EOM are normal.  NECK: Normal range of motion, supple. SKIN: Skin is warm and dry.  NEUROLOGIC: Alert and oriented to person, place, and time. No cranial nerve deficit noted. PSYCHIATRIC: Normal mood and affect. Normal behavior. Normal judgment and thought content. CARDIOVASCULAR: Normal heart rate noted RESPIRATORY: Effort and breath sounds normal, no problems with respiration noted ABDOMEN: Soft, no distention noted, non-tender.   PELVIC: Deferred MUSCULOSKELETAL: Normal range of motion. No edema noted.  Labs and Imaging US Pelvic Complete With Transvaginal  Result Date: 01/18/2018 CLINICAL DATA:  Vaginal bleeding for 3 weeks EXAM: TRANSABDOMINAL AND TRANSVAGINAL ULTRASOUND OF PELVIS TECHNIQUE: Both transabdominal and transvaginal ultrasound examinations of the pelvis were performed. Transabdominal technique was performed for global imaging of the pelvis including uterus, ovaries, adnexal regions, and pelvic cul-de-sac. It was necessary to proceed with endovaginal exam following the transabdominal exam to visualize the ovaries. COMPARISON:  None FINDINGS: Uterus Measurements: 11.3 x 6.2 x 6.6 cm. Multiple prominent fibroids. Left lateral pedunculated fibroid measures 3.8 x 2.9 x 3.5 cm. Left submucosal fibroid measures 2.9 x 2.9 x 2.8 cm. Fundal fibroid measures 2.8 x 2.9 x 3.2 cm. Endometrium Thickness: 5.3 mm.  No focal abnormality visualized. Right ovary Measurements: 3 x 1.3 x 1.4 cm.  Normal appearance/no adnexal mass. Left ovary Not seen Other findings No abnormal free fluid. IMPRESSION: 1. Enlarged fibroid uterus 2. Endometrial thickness of 5.3 mm. If bleeding remains unresponsive to hormonal or medical therapy,  sonohysterogram should be considered for focal lesion work-up. (Ref: Radiological Reasoning: Algorithmic Workup of Abnormal Vaginal Bleeding with Endovaginal Sonography and Sonohysterography. AJR 2008; 390:Z00-92) 3. Nonvisualized left ovary Electronically Signed   By: Donavan Foil M.D.   On: 01/18/2018 04:02    Assessment & Plan:  1. Menorrhagia with irregular cycle Most likely due to uterine fibroids. Patient to continue Megace at this time. She does not want to be on the medication long term.  2. Uterine leiomyoma, unspecified location Patient to return in 1 month to discuss possible surgical options to help with bleeding and fibroids. Patient does not want a hysterectomy at this time. She is interested in ablation therapy. Handout given on fibroids and treatment options to review prior to visit.   3. Iron deficiency anemia due to chronic blood loss Hbg stable at this time. Megace helping to control bleeding. Patient to continue her iron supplementation.    Return in about 1 month (around 02/23/2018) for gyn visit fibroids.   Total face-to-face time with patient: 25 minutes. Over 50% of encounter was spent on counseling and coordination of care.  Luiz Blare, DO OB Fellow [facility], [facility]

## 2018-01-27 ENCOUNTER — Encounter: Payer: Self-pay | Admitting: Obstetrics and Gynecology

## 2018-02-15 ENCOUNTER — Ambulatory Visit (INDEPENDENT_AMBULATORY_CARE_PROVIDER_SITE_OTHER): Payer: Self-pay | Admitting: Obstetrics and Gynecology

## 2018-02-15 ENCOUNTER — Encounter: Payer: Self-pay | Admitting: Obstetrics and Gynecology

## 2018-02-15 VITALS — BP 142/82 | HR 71 | Ht 64.0 in | Wt 136.2 lb

## 2018-02-15 DIAGNOSIS — D259 Leiomyoma of uterus, unspecified: Secondary | ICD-10-CM

## 2018-02-15 DIAGNOSIS — N939 Abnormal uterine and vaginal bleeding, unspecified: Secondary | ICD-10-CM

## 2018-02-15 MED ORDER — MEDROXYPROGESTERONE ACETATE 10 MG PO TABS: 10.0000 mg | ORAL_TABLET | Freq: Every day | ORAL | 2 refills | Status: DC

## 2018-02-15 NOTE — Progress Notes (Signed)
GYNECOLOGY OFFICE FOLLOW UP NOTE  History:  47 y.o. [id number] here today for follow up for surgical consult for heavy vaginal bleeding. Had irregular bleeding beginning January of this year. Was admitted to hospital with H/H of 3.8/14.6 and received 5 units PRBCs and started on Megace BID. Period started last Wednesday and is still going, states it is quite heavy. She states with her normal period, she would have stopped by now but period is still "flowing."  She originally was counseled regarding surgery but is open to medical management as she currently is without insurance.  Past Medical History:  Diagnosis Date  . Fibroid   . Medical history non-contributory     Past Surgical History:  Procedure Laterality Date  . TUBAL LIGATION  1996    Current Outpatient Medications:  .  ferrous sulfate (IRON SUPPLEMENT) 325 (65 FE) MG tablet, Take 1 tablet (325 mg total) by mouth 2 (two) times daily with a meal., Disp: 60 tablet, Rfl: 0 .  megestrol (MEGACE) 40 MG tablet, Take 1 tablet (40 mg total) by mouth 2 (two) times daily., Disp: 60 tablet, Rfl: 0 .  medroxyPROGESTERone (PROVERA) 10 MG tablet, Take 1 tablet (10 mg total) by mouth daily. Use for ten days starting on day 16 of your monthly cycle, Disp: 10 tablet, Rfl: 2  The following portions of the patient's history were reviewed and updated as appropriate: allergies, current medications, past family history, past medical history, past social history, past surgical history and problem list.   Review of Systems:  Pertinent items noted in HPI and remainder of comprehensive ROS otherwise negative.   Objective:  Physical Exam BP (!) 142/82   Pulse 71   Ht 5\' 4"  (1.626 m)   Wt 136 lb 3.2 oz (61.8 kg)   LMP 01/18/2018 (Exact Date)   BMI 23.38 kg/m  CONSTITUTIONAL: Well-developed, well-nourished female in no acute distress.  HENT:  Normocephalic, atraumatic. External right and left ear normal. Oropharynx is clear and moist EYES:  Conjunctivae and EOM are normal. Pupils are equal, round, and reactive to light. No scleral icterus.  NECK: Normal range of motion, supple, no masses SKIN: Skin is warm and dry. No rash noted. Not diaphoretic. No erythema. No pallor. NEUROLOGIC: Alert and oriented to person, place, and time. Normal reflexes, muscle tone coordination. No cranial nerve deficit noted. PSYCHIATRIC: Normal mood and affect. Normal behavior. Normal judgment and thought content. CARDIOVASCULAR: Normal heart rate noted RESPIRATORY: Effort and breath sounds normal, no problems with respiration noted ABDOMEN: Soft, no distention noted.   PELVIC: normal appearing external female genitalia, narrow introitus, normal appearing cervix with pooling of dark red blood in vagina, uterus 12 weeks sized, mobile and fibroids easily palpable, no adenxal tenderness or fullness MUSCULOSKELETAL: Normal range of motion. No edema noted.  Labs and Imaging US Pelvic Complete With Transvaginal  Result Date: 01/18/2018 CLINICAL DATA:  Vaginal bleeding for 3 weeks EXAM: TRANSABDOMINAL AND TRANSVAGINAL ULTRASOUND OF PELVIS TECHNIQUE: Both transabdominal and transvaginal ultrasound examinations of the pelvis were performed. Transabdominal technique was performed for global imaging of the pelvis including uterus, ovaries, adnexal regions, and pelvic cul-de-sac. It was necessary to proceed with endovaginal exam following the transabdominal exam to visualize the ovaries. COMPARISON:  None FINDINGS: Uterus Measurements: 11.3 x 6.2 x 6.6 cm. Multiple prominent fibroids. Left lateral pedunculated fibroid measures 3.8 x 2.9 x 3.5 cm. Left submucosal fibroid measures 2.9 x 2.9 x 2.8 cm. Fundal fibroid measures 2.8 x 2.9 x 3.2 cm. Endometrium Thickness:  5.3 mm.  No focal abnormality visualized. Right ovary Measurements: 3 x 1.3 x 1.4 cm.  Normal appearance/no adnexal mass. Left ovary Not seen Other findings No abnormal free fluid. IMPRESSION: 1. Enlarged fibroid  uterus 2. Endometrial thickness of 5.3 mm. If bleeding remains unresponsive to hormonal or medical therapy, sonohysterogram should be considered for focal lesion work-up. (Ref: Radiological Reasoning: Algorithmic Workup of Abnormal Vaginal Bleeding with Endovaginal Sonography and Sonohysterography. AJR 2008; 702:O37-85) 3. Nonvisualized left ovary Electronically Signed   By: Donavan Foil M.D.   On: 01/18/2018 04:02    Assessment & Plan:   1. Uterine leiomyoma, unspecified location See below  2. Abnormal uterine bleeding (AUB) Patient with irregular periods and some heavy bleeding since March, s/p transfusion 5 units pRBCs in hospital here for management of AUB. She is open to medical management as she doesn't have health insurance and losing two months of income would be a financial hardship. Reviewed that if main cause of AUB is fibroids, medical management may not be effective and she may eventually need hysterectomy. Reviewed that if AUB is not improved by medical management, would recommend EMB, to which she is agreeable. She has been on Megace about a month and states her period this month is not any lighter. Counseled regarding different options including continuing megace, oral progesterone, depo progesterone, lysteda, CHC (contraindicated with her elevated BP), IUD. She is agreeable to starting cyclical oral progesterone, reviewed instructions. To return 3 months for f/u and possible EMB.  Free pap clinic info given  Routine preventative health maintenance measures emphasized. Please refer to After Visit Summary for other counseling recommendations.   Return in about 3 months (around 05/18/2018).   Feliz Beam, M.D. Attending [place], [facility] for [employer], [place]

## 2018-03-10 ENCOUNTER — Telehealth: Payer: Self-pay | Admitting: General Practice

## 2018-03-10 NOTE — Telephone Encounter (Signed)
Patient called and left message on nurse voicemail line stating she came in last month for irregular bleeding & was given a medication. Patient states she has been taking the medicine and is still bleeding since her visit with [place]. Called patient & she reports taking the provera for 10 days but it hasn't helped with her bleeding. Patient reports bleeding is heavy. Told patient I would reach out to Dr Rosana Hoes and would call her back whenever I hear something. Patient verbalized understanding & had no questions at this time.

## 2018-03-12 NOTE — Telephone Encounter (Signed)
Per Dr Rosana Hoes, Please advise patient to double her provera (20 mg daily) and take it for the full month (rather than cyclical) and call if no improvement.   Called patient, no answer- left message to call us back concerning a change in her medication. Patient will likely need new Rx given higher dose.

## 2018-07-06 ENCOUNTER — Other Ambulatory Visit: Payer: Self-pay

## 2018-07-06 ENCOUNTER — Observation Stay (HOSPITAL_COMMUNITY)
Admission: EM | Admit: 2018-07-06 | Discharge: 2018-07-07 | Disposition: A | Discharge status: Home / Self Care | Payer: Self-pay | Attending: Internal Medicine | Admitting: Internal Medicine

## 2018-07-06 ENCOUNTER — Encounter (HOSPITAL_COMMUNITY): Payer: Self-pay

## 2018-07-06 DIAGNOSIS — N939 Abnormal uterine and vaginal bleeding, unspecified: Secondary | ICD-10-CM

## 2018-07-06 DIAGNOSIS — D251 Intramural leiomyoma of uterus: Secondary | ICD-10-CM | POA: Insufficient documentation

## 2018-07-06 DIAGNOSIS — D252 Subserosal leiomyoma of uterus: Secondary | ICD-10-CM | POA: Insufficient documentation

## 2018-07-06 DIAGNOSIS — D649 Anemia, unspecified: Secondary | ICD-10-CM

## 2018-07-06 DIAGNOSIS — D500 Iron deficiency anemia secondary to blood loss (chronic): Principal | ICD-10-CM | POA: Insufficient documentation

## 2018-07-06 DIAGNOSIS — D259 Leiomyoma of uterus, unspecified: Secondary | ICD-10-CM | POA: Diagnosis present

## 2018-07-06 DIAGNOSIS — N920 Excessive and frequent menstruation with regular cycle: Secondary | ICD-10-CM | POA: Insufficient documentation

## 2018-07-06 LAB — CBC
HEMATOCRIT: 16.1 % — AB (ref 36.0–46.0)
HEMOGLOBIN: 4.1 g/dL — AB (ref 12.0–15.0)
MCH: 15.4 pg — AB (ref 26.0–34.0)
MCHC: 25.5 g/dL — AB (ref 30.0–36.0)
MCV: 60.3 fL — ABNORMAL LOW (ref 80.0–100.0)
Platelets: 198 10*3/uL (ref 150–400)
RBC: 2.67 MIL/uL — ABNORMAL LOW (ref 3.87–5.11)
RDW: 20.4 % — AB (ref 11.5–15.5)
WBC: 4.9 10*3/uL (ref 4.0–10.5)
nRBC: 0 % (ref 0.0–0.2)

## 2018-07-06 LAB — BASIC METABOLIC PANEL
Anion gap: 6 (ref 5–15)
BUN: 11 mg/dL (ref 6–20)
CHLORIDE: 108 mmol/L (ref 98–111)
CO2: 26 mmol/L (ref 22–32)
CREATININE: 0.71 mg/dL (ref 0.44–1.00)
Calcium: 9.1 mg/dL (ref 8.9–10.3)
GFR calc Af Amer: >60 mL/min (ref >60)
GLUCOSE: 103 mg/dL — AB (ref 70–99)
POTASSIUM: 3.6 mmol/L (ref 3.5–5.1)
Sodium: 140 mmol/L (ref 135–145)

## 2018-07-06 LAB — I-STAT BETA HCG BLOOD, ED (MC, WL, AP ONLY): I-stat hCG, quantitative: <5 m[IU]/mL (ref <5)

## 2018-07-06 LAB — PREPARE RBC (CROSSMATCH)

## 2018-07-06 MED ORDER — SODIUM CHLORIDE 0.9% IV SOLUTION
Freq: Once | INTRAVENOUS | Status: AC
  Administered 2018-07-06: via INTRAVENOUS

## 2018-07-06 MED ORDER — MEDROXYPROGESTERONE ACETATE 10 MG PO TABS
20.0000 mg | ORAL_TABLET | Freq: Once | ORAL | Status: AC
  Administered 2018-07-06: 20 mg via ORAL
  Filled 2018-07-06: qty 2

## 2018-07-06 NOTE — ED Notes (Signed)
Staff member on the way to get blood. Consent signed

## 2018-07-06 NOTE — ED Triage Notes (Signed)
Pt reports that she has been having vaginal bleeding x 1 month and reports that she has been having clots. Pt reports similar episodes and required blood transfusion in the past . Pt reports that she tried to schedule a GYN.  but Provider but is unable to get an appt. Until Dec. Pt report s that she has been on hormone therapy but has ran out of medication.

## 2018-07-06 NOTE — ED Provider Notes (Signed)
[facility] DEPT Provider Note   CSN: [id number] Arrival date & time: 07/06/18  1925     History   Chief Complaint Chief Complaint  Patient presents with  . Vaginal Bleeding    HPI Tasha Payne is a 47 y.o. female history of fibroids, menorrhagia here presented with anemia, persistent vaginal bleeding.  Patient was admitted earlier this year for transfusion secondary to bleeding from fibroids.  Patient's hemoglobin at that time was 3 and she received 5 units of red blood cells.  She also was started on Megace and eventually transition to Provera.  Patient states that she ran out of Provera about a month ago and started having vaginal bleeding for the last month.  She states that she has used numerous pads daily.  She feels lightheaded and dizzy like she is going to pass out.  Patient has followed up with women's clinic since discharge.   The history is provided by the patient.    Past Medical History:  Diagnosis Date  . Fibroid   . Medical history non-contributory     Patient Active Problem List   Diagnosis Date Noted  . Iron deficiency anemia due to chronic blood loss 01/19/2018  . Symptomatic anemia 01/18/2018  . Menorrhagia 01/18/2018  . Uterine fibroid 01/18/2018    Past Surgical History:  Procedure Laterality Date  . TUBAL LIGATION  1996     OB History    Gravida  3   Para  3   Term  3   Preterm  0   AB  0   Living  3     SAB  0   TAB  0   Ectopic  0   Multiple  0   Live Births  3            Home Medications    Prior to Admission medications   Medication Sig Start Date End Date Taking? Authorizing Provider  ibuprofen (ADVIL,MOTRIN) 200 MG tablet Take 200 mg by mouth every 6 (six) hours as needed for moderate pain.   Yes [provider]  ferrous sulfate (IRON SUPPLEMENT) 325 (65 FE) MG tablet Take 1 tablet (325 mg total) by mouth 2 (two) times daily with a meal. Patient not taking: Reported on  07/06/2018 01/19/18   Dessa Phi, DO  medroxyPROGESTERone (PROVERA) 10 MG tablet Take 1 tablet (10 mg total) by mouth daily. Use for ten days starting on day 16 of your monthly cycle Patient not taking: Reported on 07/06/2018 02/15/18   Sloan Leiter, MD    Family History Family History  Problem Relation Age of Onset  . Diabetes Father     Social History Social History   Tobacco Use  . Smoking status: Never Smoker  . Smokeless tobacco: Never Used  Substance Use Topics  . Alcohol use: Yes    Comment: occasionally  . Drug use: Yes    Types: Marijuana    Comment: occasionally     Allergies   Patient has no known allergies.   Review of Systems Review of Systems  Genitourinary: Positive for vaginal bleeding.  All other systems reviewed and are negative.    Physical Exam Updated Vital Signs BP 126/75 (BP Location: Right Arm)   Pulse 85   Temp 98.7 F (37.1 C) (Oral)   Resp 18   Ht 5\' 4"  (1.626 m)   Wt 61.2 kg   SpO2 100%   BMI 23.17 kg/m   Physical Exam  Constitutional:  She is oriented to person, place, and time. She appears well-developed and well-nourished.  HENT:  Head: Normocephalic.  Mouth/Throat: Oropharynx is clear and moist.  Eyes:  Pale   Neck: Normal range of motion. Neck supple.  Cardiovascular: Normal rate, regular rhythm and normal heart sounds.  Pulmonary/Chest: Effort normal and breath sounds normal. No stridor. No respiratory distress.  Abdominal: Soft. Bowel sounds are normal. She exhibits no distension. There is no tenderness.  Genitourinary:  Genitourinary Comments: + blood in vault. Os closed. No uterine tenderness   Musculoskeletal: Normal range of motion.  Neurological: She is alert and oriented to person, place, and time. No cranial nerve deficit. Coordination normal.  Skin: Skin is warm. Capillary refill takes less than 2 seconds.  Psychiatric: She has a normal mood and affect.  Nursing note and vitals reviewed.    ED  Treatments / Results  Labs (all labs ordered are listed, but only abnormal results are displayed) Labs Reviewed  CBC - Abnormal; Notable for the following components:      Result Value   RBC 2.67 (*)    Hemoglobin 4.1 (*)    HCT 16.1 (*)    MCV 60.3 (*)    MCH 15.4 (*)    MCHC 25.5 (*)    RDW 20.4 (*)    All other components within normal limits  BASIC METABOLIC PANEL - Abnormal; Notable for the following components:   Glucose, Bld 103 (*)    All other components within normal limits  I-STAT BETA HCG BLOOD, ED ([facility], [facility], [facility] ONLY)  PREPARE RBC (CROSSMATCH)  TYPE AND SCREEN  GC/CHLAMYDIA PROBE AMP ([place]) NOT AT [facility]  WET PREP  (BD AFFIRM) ([place])    EKG None  Radiology No results found.  Procedures Procedures (including critical care time)  CRITICAL CARE Performed by: Wandra Arthurs   Total critical care time: 30 minutes  Critical care time was exclusive of separately billable procedures and treating other patients.  Critical care was necessary to treat or prevent imminent or life-threatening deterioration.  Critical care was time spent personally by me on the following activities: development of treatment plan with patient and/or surrogate as well as nursing, discussions with consultants, evaluation of patient's response to treatment, examination of patient, obtaining history from patient or surrogate, ordering and performing treatments and interventions, ordering and review of laboratory studies, ordering and review of radiographic studies, pulse oximetry and re-evaluation of patient's condition.   Medications Ordered in ED Medications  0.9 %  sodium chloride infusion (Manually program via Guardrails IV Fluids) (has no administration in time range)  medroxyPROGESTERone (PROVERA) tablet 20 mg (has no administration in time range)     Initial Impression / Assessment and Plan / ED Course  I have reviewed the triage vital signs and the nursing  notes.  Pertinent labs & imaging results that were available during my care of the patient were reviewed by me and considered in my medical decision making (see chart for details).    Tasha Payne is a 47 y.o. female here with vaginal bleeding. Vaginal bleeding for a month, had recent admission that required transfusion. Will get labs and if Hg less than 7, will need transfusion.   10 pm Hg 4.1. I called Dr. Nehemiah Settle from GYN. He recommend transfusion, provera 20 mg now and provera 20 mg daily and outpatient GYN follow up. Hospitalist to admit for transfusion for symptomatic anemia.    Final Clinical Impressions(s) / ED Diagnoses  Final diagnoses:  None    ED Discharge Orders    None       Drenda Freeze, MD 07/06/18 [phone]

## 2018-07-07 ENCOUNTER — Observation Stay (HOSPITAL_COMMUNITY): Payer: Self-pay

## 2018-07-07 DIAGNOSIS — N921 Excessive and frequent menstruation with irregular cycle: Secondary | ICD-10-CM

## 2018-07-07 DIAGNOSIS — D649 Anemia, unspecified: Secondary | ICD-10-CM

## 2018-07-07 DIAGNOSIS — D259 Leiomyoma of uterus, unspecified: Secondary | ICD-10-CM

## 2018-07-07 LAB — CBC
HCT: 24 % — ABNORMAL LOW (ref 36.0–46.0)
HEMOGLOBIN: 7.1 g/dL — AB (ref 12.0–15.0)
MCH: 20.5 pg — AB (ref 26.0–34.0)
MCHC: 29.6 g/dL — ABNORMAL LOW (ref 30.0–36.0)
MCV: 69.2 fL — AB (ref 80.0–100.0)
Platelets: 153 10*3/uL (ref 150–400)
RBC: 3.47 MIL/uL — ABNORMAL LOW (ref 3.87–5.11)
RDW: 26.9 % — ABNORMAL HIGH (ref 11.5–15.5)
WBC: 4.5 10*3/uL (ref 4.0–10.5)
nRBC: 0 % (ref 0.0–0.2)

## 2018-07-07 LAB — FERRITIN: Ferritin: 2 ng/mL — ABNORMAL LOW (ref 11–307)

## 2018-07-07 LAB — WET PREP, GENITAL
CLUE CELLS WET PREP: NONE SEEN
Sperm: NONE SEEN
Trich, Wet Prep: NONE SEEN
Yeast Wet Prep HPF POC: NONE SEEN

## 2018-07-07 LAB — GC/CHLAMYDIA PROBE AMP (~~LOC~~) NOT AT ARMC
CHLAMYDIA, DNA PROBE: NEGATIVE
NEISSERIA GONORRHEA: NEGATIVE

## 2018-07-07 MED ORDER — MEDROXYPROGESTERONE ACETATE 10 MG PO TABS: 20.0000 mg | ORAL_TABLET | Freq: Every day | ORAL | 0 refills | Status: DC

## 2018-07-07 MED ORDER — FERROUS SULFATE 325 (65 FE) MG PO TABS: 325.0000 mg | ORAL_TABLET | Freq: Two times a day (BID) | ORAL | 3 refills | Status: DC

## 2018-07-07 MED ORDER — MEDROXYPROGESTERONE ACETATE 10 MG PO TABS
20.0000 mg | ORAL_TABLET | Freq: Every day | ORAL | Status: DC
  Administered 2018-07-07: 20 mg via ORAL
  Filled 2018-07-07: qty 2

## 2018-07-07 MED ORDER — FERROUS SULFATE 325 (65 FE) MG PO TABS
325.0000 mg | ORAL_TABLET | Freq: Two times a day (BID) | ORAL | Status: DC
  Administered 2018-07-07 (×2): 325 mg via ORAL
  Filled 2018-07-07 (×3): qty 1

## 2018-07-07 MED ORDER — SODIUM CHLORIDE 0.9 % IV SOLN
510.0000 mg | Freq: Once | INTRAVENOUS | Status: AC
  Administered 2018-07-07: 510 mg via INTRAVENOUS
  Filled 2018-07-07: qty 17

## 2018-07-07 NOTE — Care Management Note (Signed)
Case Management Note  CM consulted for hospital follow up needs with no pcp and no ins. CM placed 3 clinics on AVS for pt schedule a follow up appointment with, including financial counseling and pharmacy information.  CM sent a message to [facility] CM for possible appointment openings.  No further CM needs noted at this time.  Elvi Leventhal, Benjaman Lobe, RN 07/07/2018, 4:44 PM

## 2018-07-07 NOTE — ED Notes (Signed)
Discharge orders given by Dr. Loleta Books.  Pt given discharge instructions and expressed understanding.  Pt ambulated out of department without issue.

## 2018-07-07 NOTE — ED Notes (Signed)
US at bedside

## 2018-07-07 NOTE — ED Notes (Signed)
RACHEL RN  3 WEST GIVEN UPDATE ON PT'S CURRENT STATUS.

## 2018-07-07 NOTE — ED Notes (Signed)
Pt able to ambulate to the bathroom with no assistance. No reports of dizziness. Steady gait

## 2018-07-07 NOTE — Progress Notes (Signed)
Spoke to [place], RN to get report on pt.  Pt receiving IV feraheme.  Per Caryl Pina, she may be d/c'd per MD, but will be kept in ED to monitor for reaction. Caryl Pina stated she will call floor RN when appropriate.

## 2018-07-07 NOTE — H&P (Signed)
History and Physical    Tasha Payne ZDG:[id number] DOB: Sep 20, 1970 DOA: 07/06/2018  PCP: Patient, No Pcp Per Patient coming from: Home  Chief Complaint: Vaginal bleeding  HPI: Tasha Payne is a 47 y.o. female with medical history significant of fibroids, menorrhagia presenting to the ED for evaluation of vaginal bleeding.  Patient states she has been having increased vaginal bleeding for the past 1 month after she ran out of her home medication Provera.  She has not been able to see her gynecologist as the next appointment is not available until December.  Reports having monthly menstrual cycles (first week of each month) which last about 5 days.  States she is going through multiple pads each day because of her vaginal bleeding.  Reports having history of fibroids.  Denies having any vaginal pain.  Denies taking any blood thinners or using NSAIDs regularly.  Reports having dyspnea on exertion.  Denies having any dizziness or chest pain.  Reports having intermittent lower abdominal pain; no pain at present.  Patient was admitted to the hospital in May 2019 for vaginal bleeding and symptomatic anemia.  She received a total of 5 units of PRBC transfusions as well as IV iron during that hospitalization.  ED Course: Vitals stable on arrival.  Hemoglobin 4.1, MCV 60.  Wet prep done in the ED negative for yeast, trichomonas, or BV.  ED physician spoke to Dr. Nehemiah Settle from GYN. He recommended transfusion, provera 20 mg now and provera 20 mg daily and outpatient GYN follow up.  3 units packed red blood cells ordered in the ED. [facility] paged to admit.  Review of Systems: As per HPI otherwise 10 point review of systems negative.  Past Medical History:  Diagnosis Date  . Fibroid   . Medical history non-contributory     Past Surgical History:  Procedure Laterality Date  . TUBAL LIGATION  1996     reports that she has never smoked. She has never used smokeless tobacco. She reports that she drinks  alcohol. She reports that she has current or past drug history. Drug: Marijuana.  No Known Allergies  Family History  Problem Relation Age of Onset  . Diabetes Father     Prior to Admission medications   Medication Sig Start Date End Date Taking? Authorizing Provider  ibuprofen (ADVIL,MOTRIN) 200 MG tablet Take 200 mg by mouth every 6 (six) hours as needed for moderate pain.   Yes [provider]  ferrous sulfate (IRON SUPPLEMENT) 325 (65 FE) MG tablet Take 1 tablet (325 mg total) by mouth 2 (two) times daily with a meal. Patient not taking: Reported on 07/06/2018 01/19/18   Dessa Phi, DO  medroxyPROGESTERone (PROVERA) 10 MG tablet Take 1 tablet (10 mg total) by mouth daily. Use for ten days starting on day 16 of your monthly cycle Patient not taking: Reported on 07/06/2018 02/15/18   Sloan Leiter, MD    Physical Exam: Vitals:   07/07/18 0150 07/07/18 0222 07/07/18 0250 07/07/18 0250  BP: (!) 109/57 (!) 107/52 140/74 140/74  Pulse: 90 79 75 76  Resp: 19 20 (!) 21 20  Temp:    98.8 F (37.1 C)  TempSrc:    Oral  SpO2: 100% 100% 100%   Weight:      Height:       Physical Exam  Constitutional: She is oriented to person, place, and time. She appears well-developed and well-nourished. No distress.  Resting comfortably in a hospital stretcher  HENT:  Head:  Normocephalic and atraumatic.  Mouth/Throat: Oropharynx is clear and moist.  Eyes:  Pale conjunctiva  Neck: Neck supple. No tracheal deviation present.  Cardiovascular: Normal rate, regular rhythm and intact distal pulses.  Pulmonary/Chest: Effort normal and breath sounds normal. No respiratory distress. She has no wheezes. She has no rales.  Abdominal: Soft. Bowel sounds are normal. She exhibits no distension. There is no tenderness.  Musculoskeletal: She exhibits no edema.  Neurological: She is alert and oriented to person, place, and time.  Skin: Skin is warm and dry. She is not diaphoretic.  Psychiatric:  She has a normal mood and affect.     Labs on Admission: I have personally reviewed following labs and imaging studies  CBC: Recent Labs  Lab 07/06/18 2030  WBC 4.9  HGB 4.1*  HCT 16.1*  MCV 60.3*  PLT 174   Basic Metabolic Panel: Recent Labs  Lab 07/06/18 2147  NA 140  K 3.6  CL 108  CO2 26  GLUCOSE 103*  BUN 11  CREATININE 0.71  CALCIUM 9.1   GFR: Estimated Creatinine Clearance: 76.7 mL/min (by C-G formula based on SCr of 0.71 mg/dL). Liver Function Tests: No results for input(s): AST, ALT, ALKPHOS, BILITOT, PROT, ALBUMIN in the last 168 hours. No results for input(s): LIPASE, AMYLASE in the last 168 hours. No results for input(s): AMMONIA in the last 168 hours. Coagulation Profile: No results for input(s): INR, PROTIME in the last 168 hours. Cardiac Enzymes: No results for input(s): CKTOTAL, CKMB, CKMBINDEX, TROPONINI in the last 168 hours. BNP (last 3 results) No results for input(s): PROBNP in the last 8760 hours. HbA1C: No results for input(s): HGBA1C in the last 72 hours. CBG: No results for input(s): GLUCAP in the last 168 hours. Lipid Profile: No results for input(s): CHOL, HDL, LDLCALC, TRIG, CHOLHDL, LDLDIRECT in the last 72 hours. Thyroid Function Tests: No results for input(s): TSH, T4TOTAL, FREET4, T3FREE, THYROIDAB in the last 72 hours. Anemia Panel: No results for input(s): VITAMINB12, FOLATE, FERRITIN, TIBC, IRON, RETICCTPCT in the last 72 hours. Urine analysis:    Component Value Date/Time   COLORURINE RED (A) 01/18/2018 0213   APPEARANCEUR CLOUDY (A) 01/18/2018 0213   LABSPEC 1.020 01/18/2018 0213   PHURINE 7.5 01/18/2018 0213   GLUCOSEU NEGATIVE 01/18/2018 0213   HGBUR LARGE (A) 01/18/2018 0213   BILIRUBINUR NEGATIVE 01/18/2018 0213   KETONESUR NEGATIVE 01/18/2018 0213   PROTEINUR >300 (A) 01/18/2018 0213   UROBILINOGEN 0.2 11/30/2012 1828   NITRITE NEGATIVE 01/18/2018 0213   LEUKOCYTESUR TRACE (A) 01/18/2018 0213    Radiological  Exams on Admission: No results found.  Assessment/Plan Principal Problem:   Symptomatic anemia Active Problems:   Menorrhagia   Uterine fibroid   Symptomatic anemia secondary to menorrhagia in the setting of uterine fibroids -Vitals stable on arrival.  Hemoglobin 4.1, MCV 60.  ED physician spoke to Dr. Nehemiah Settle from GYN. He recommended transfusion, provera 20 mg now and provera 20 mg daily and outpatient GYN follow up.  3 units packed red blood cells ordered in the ED.  -Repeat CBC after blood transfusion -Provera 20 mg daily -Pelvic ultrasound -Check ferritin level -Continue home iron supplement -GC chlamydia probe  DVT prophylaxis: SCDs Family Communication: No family present at bedside. Disposition Plan: Anticipate discharge to home in 1 to 2 days. Consults called: None Admission status: Observation   Shela Leff MD [facility] Pager [phone]  If 7PM-7AM, please contact night-coverage [email or web address] Password [email or web address]  07/07/2018, 3:00 AM

## 2018-07-07 NOTE — ED Notes (Signed)
Pt allowed to eat and drink per Admitting, MD

## 2018-07-07 NOTE — Progress Notes (Signed)
Consult request has been received. CSW attempting to follow up at present time.  CSW noted pt had no insurance and a request was made due to this for social work.  CSW met with pt and provided pt with contact information for the Social Security administration for disability needs and [place] DSS for possible future Medicaid needs.  CSW also provided pt with contact info for the [facility] should pt needs disability needs.  .Please reconsult if future social work needs arise.  CSW signing off, as social work intervention is no longer needed.   F. , LCSW, LCAS, CSI Clinical Social Worker Ph: [phone]         

## 2018-07-07 NOTE — ED Notes (Signed)
ADMISSION MD UPDATED AND AWARE OF SOCIAL WORK AND CASE MANAGEMENT PENDING. PT'S PLAN IS TO BE DISCHARGED FROM EMERGENCY DEPARTMENT.

## 2018-07-07 NOTE — ED Notes (Addendum)
PER ADMISSION MD, PT WILL RECEIVE FERAHEME IVPB AND BE DISCHARGE. PLEASE PAGE ADMISSION MD FOR DISCHARGE AFTER COMPLETION OF FERAHEME.  PHARMACY CALLED FOR DIRECTIONS ON ADMINISTRATION OF FERAHEME. MEDICATION GIVEN TO THIS WRITER AT 1440

## 2018-07-07 NOTE — ED Notes (Signed)
ED TO INPATIENT HANDOFF REPORT  Name/Age/Gender Tasha Payne 47 y.o. female  Code Status    Code Status Orders  (From admission, onward)         Start     Ordered   07/07/18 0249  Full code  Continuous     07/07/18 0251        Code Status History    Date Active Date Inactive Code Status Order ID Comments User Context   01/18/2018 0556 01/19/2018 2256 Full Code [id number]  Norval Morton, MD ED      Home/SNF/Other Home  Chief Complaint Vaginal Bleeding   Level of Care/Admitting Diagnosis ED Disposition    ED Disposition Condition [place]: [facility] [[id number]]  Level of Care: Med-Surg [16]  Diagnosis: Symptomatic anemia [0865784]  Admitting Physician: Shela Leff [[id number]]  Attending Physician: Shela Leff [[id number]]  PT Class (Do Not Modify): Observation [104]  PT Acc Code (Do Not Modify): Observation [10022]       Medical History Past Medical History:  Diagnosis Date  . Fibroid   . Medical history non-contributory     Allergies No Known Allergies  IV Location/Drains/Wounds Patient Lines/Drains/Airways Status   Active Line/Drains/Airways    Name:   Placement date:   Placement time:   Site:   Days:   Peripheral IV 07/06/18 Left Antecubital   07/06/18    2202    Antecubital   1          Labs/Imaging Results for orders placed or performed during the hospital encounter of 07/06/18 (from the past 48 hour(s))  CBC     Status: Abnormal   Collection Time: 07/06/18  8:30 PM  Result Value Ref Range   WBC 4.9 4.0 - 10.5 K/uL   RBC 2.67 (L) 3.87 - 5.11 MIL/uL   Hemoglobin 4.1 (LL) 12.0 - 15.0 g/dL    Comment: REPEATED TO VERIFY Reticulocyte Hemoglobin testing may be clinically indicated, consider ordering this additional test [id number] THIS CRITICAL RESULT HAS VERIFIED AND BEEN CALLED TO K GIBSON RN BY AMELIA NAVARRO ON 10 29 2019 AT 2106, AND HAS BEEN READ BACK.     HCT 16.1 (L) 36.0 - 46.0 %    MCV 60.3 (L) 80.0 - 100.0 fL   MCH 15.4 (L) 26.0 - 34.0 pg   MCHC 25.5 (L) 30.0 - 36.0 g/dL   RDW 20.4 (H) 11.5 - 15.5 %   Platelets 198 150 - 400 K/uL    Comment: REPEATED TO VERIFY SPECIMEN CHECKED FOR CLOTS    nRBC 0.0 0.0 - 0.2 %    Comment: Performed at [facility], [place] [address] [place], [place] [address]  I-Stat beta hCG blood, ED     Status: None   Collection Time: 07/06/18  8:32 PM  Result Value Ref Range   I-stat hCG, quantitative <5.0 <5 mIU/mL   Comment 3            Comment:   GEST. AGE      CONC.  (mIU/mL)   <=1 WEEK        5 - 50     2 WEEKS       50 - 500     3 WEEKS       100 - 10,000     4 WEEKS     1,000 - 30,000        FEMALE AND NON-PREGNANT FEMALE:     LESS THAN  5 mIU/mL   Wet prep, genital     Status: Abnormal   Collection Time: 07/06/18  9:00 PM  Result Value Ref Range   Yeast Wet Prep HPF POC NONE SEEN NONE SEEN   Trich, Wet Prep NONE SEEN NONE SEEN   Clue Cells Wet Prep HPF POC NONE SEEN NONE SEEN   WBC, Wet Prep HPF POC FEW (A) NONE SEEN   Sperm NONE SEEN     Comment: Performed at [facility], [place] [address], [place], [place] [address]  Type and screen     Status: None (Preliminary result)   Collection Time: 07/06/18  9:47 PM  Result Value Ref Range   ABO/RH(D) A POS    Antibody Screen NEG    Sample Expiration 07/09/2018    Unit Number [id number]    Blood Component Type RED CELLS,LR    Unit division 00    Status of Unit ISSUED,FINAL    Transfusion Status OK TO TRANSFUSE    Crossmatch Result      Compatible Performed at [place] [address], [place], [place] [address]    Unit Number [id number]    Blood Component Type RBC LR PHER2    Unit division 00    Status of Unit ISSUED    Transfusion Status OK TO TRANSFUSE    Crossmatch Result Compatible    Unit Number [id number]    Blood Component Type RBC LR PHER2    Unit division 00    Status of Unit ISSUED     Transfusion Status OK TO TRANSFUSE    Crossmatch Result Compatible   Basic metabolic panel     Status: Abnormal   Collection Time: 07/06/18  9:47 PM  Result Value Ref Range   Sodium 140 135 - 145 mmol/L   Potassium 3.6 3.5 - 5.1 mmol/L   Chloride 108 98 - 111 mmol/L   CO2 26 22 - 32 mmol/L   Glucose, Bld 103 (H) 70 - 99 mg/dL   BUN 11 6 - 20 mg/dL   Creatinine, Ser 0.71 0.44 - 1.00 mg/dL   Calcium 9.1 8.9 - 10.3 mg/dL   GFR calc non Af Amer >60 >60 mL/min   GFR calc Af Amer >60 >60 mL/min    Comment: (NOTE) The eGFR has been calculated using the CKD EPI equation. This calculation has not been validated in all clinical situations. eGFR's persistently <60 mL/min signify possible Chronic Kidney Disease.    Anion gap 6 5 - 15    Comment: Performed at [facility], [place] [address] [place], [place] [address]  Prepare RBC     Status: None   Collection Time: 07/06/18  9:47 PM  Result Value Ref Range   Order Confirmation      ORDER PROCESSED BY BLOOD BANK Performed at [facility], [place] [address], [place], [place] [address]   CBC     Status: Abnormal   Collection Time: 07/07/18 11:58 AM  Result Value Ref Range   WBC 4.5 4.0 - 10.5 K/uL   RBC 3.47 (L) 3.87 - 5.11 MIL/uL   Hemoglobin 7.1 (L) 12.0 - 15.0 g/dL    Comment: Reticulocyte Hemoglobin testing may be clinically indicated, consider ordering this additional test [id number]    HCT 24.0 (L) 36.0 - 46.0 %   MCV 69.2 (L) 80.0 - 100.0 fL   MCH 20.5 (L) 26.0 - 34.0 pg   MCHC 29.6 (L) 30.0 - 36.0 g/dL  RDW 26.9 (H) 11.5 - 15.5 %   Platelets 153 150 - 400 K/uL   nRBC 0.0 0.0 - 0.2 %    Comment: Performed at [facility], [place] [address], [place], [place] [address]  Ferritin     Status: Abnormal   Collection Time: 07/07/18 11:58 AM  Result Value Ref Range   Ferritin 2 (L) 11 - 307 ng/mL    Comment: Performed at [facility], [place] [address]  [place], [place] [address]   US Pelvic Complete With Transvaginal  Result Date: 07/07/2018 CLINICAL DATA:  Initial evaluation for acute vaginal bleeding. History of fibroids. EXAM: TRANSABDOMINAL AND TRANSVAGINAL ULTRASOUND OF PELVIS TECHNIQUE: Both transabdominal and transvaginal ultrasound examinations of the pelvis were performed. Transabdominal technique was performed for global imaging of the pelvis including uterus, ovaries, adnexal regions, and pelvic cul-de-sac. It was necessary to proceed with endovaginal exam following the transabdominal exam to visualize the uterus, endometrium, and ovaries. COMPARISON:  Prior ultrasound from 01/18/2018 FINDINGS: Uterus Measurements: 13.7 x 6.2 x 4.0 cm. Multiple uterine fibroids are seen as follows. 3.6 x 3.4 x 3.4 cm subserosal fibroid present at the left mid uterine body. 3.4 x 3.4 x 3.9 cm sub mucosal fibroid present at the central uterine fundus. 2.0 x 2.0 x 3.5 cm intramural fibroid at the right mid uterine body. 4.5 x 4.8 x 4.0 cm exophytic fibroid extends from the uterine fundus. Endometrium Thickness: 5 mm.  No focal abnormality visualized. Right ovary Measurements: 2.9 x 1.3 x 2.7 cm. Normal appearance/no adnexal mass. Left ovary Measurements: 4.1 x 2.5 x 2.4 cm. Normal appearance/no adnexal mass. Other findings No abnormal free fluid. IMPRESSION: 1. Enlarged fibroid uterus as detailed above. 2. Endometrial stripe within normal limits measuring 5 mm in thickness. If bleeding remains unresponsive to hormonal or medical therapy, sonohysterogram should be considered for focal lesion work-up. (Ref: Radiological Reasoning: Algorithmic Workup of Abnormal Vaginal Bleeding with Endovaginal Sonography and Sonohysterography. AJR 2008; 893:T34-28). 3. No other acute abnormality within the pelvis. Normal sonographic appearance of the ovaries. Electronically Signed   By: Jeannine Boga M.D.   On: 07/07/2018 04:30   None  Pending Labs Unresulted Labs (From  admission, onward)   None      Vitals/Pain Today's Vitals   07/07/18 1130 07/07/18 1300 07/07/18 1530 07/07/18 1535  BP: 121/69 118/72 (!) 141/74   Pulse: (!) 57 65 76   Resp: 14 19 20    Temp:      TempSrc:      SpO2: 100% 100% 99%   Weight:      Height:      PainSc:    0-No pain    Isolation Precautions No active isolations  Medications Medications  ferrous sulfate tablet 325 mg (325 mg Oral Given 07/07/18 1201)  medroxyPROGESTERone (PROVERA) tablet 20 mg (20 mg Oral Given 07/07/18 1201)  0.9 %  sodium chloride infusion (Manually program via Guardrails IV Fluids) ( Intravenous Stopped 07/07/18 0902)  medroxyPROGESTERone (PROVERA) tablet 20 mg (20 mg Oral Given 07/06/18 2237)  ferumoxytol (FERAHEME) 510 mg in sodium chloride 0.9 % 100 mL IVPB (510 mg Intravenous New Bag/Given 07/07/18 1532)    Mobility walks

## 2018-07-08 LAB — TYPE AND SCREEN
ABO/RH(D): A POS
Antibody Screen: NEGATIVE
UNIT DIVISION: 0
UNIT DIVISION: 0
Unit division: 0

## 2018-07-08 LAB — BPAM RBC
BLOOD PRODUCT EXPIRATION DATE: 201911252359
Blood Product Expiration Date: 201911252359
Blood Product Expiration Date: 201911252359
ISSUE DATE / TIME: 201910292327
ISSUE DATE / TIME: 201910300259
ISSUE DATE / TIME: 201910300559
UNIT TYPE AND RH: 6200
Unit Type and Rh: 6200
Unit Type and Rh: 6200

## 2018-07-08 NOTE — Discharge Summary (Signed)
Physician Discharge Summary  Tasha Payne KDT:[id number] DOB: [birth year] DOA: 07/06/2018  PCP: Patient, No Pcp Per  Admit date: 07/06/2018 Discharge date: 07/07/2018  Admitted From: Home  Disposition:  Home   Recommendations for Outpatient Follow-up:  1. Follow up with Ob-Gyn in 5 days 2. [place]: Please obtain CBC in 1 month   Home Health: None  Equipment/Devices: None  Discharge Condition: Good  CODE STATUS: FULL Diet recommendation: Regular  Brief/Interim Summary: Ms Heitman is a 47 y.o. F with hx of iron deficiency anemia from severe menometrorrhagia from fibroids who presents with heavy menses and dizziness.  Found in the ER to have hemoglobin 4.1 g/dL.     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 Has been taking iron daily since then.    Had been taking Provera 10 mg starting on the 16th day of her cycle, running for 10 days only, but ran out in September.  During this time, she would essentially have 16 days heavy flow, followed by spotting, inconsistent bleeding until her next menses, and the Provera 10 was only a little helpful.   In July I see a telephone note that the patient was supposed to be told to take Provera 20 continuously and call in 30 days if no improvement, but only voicemail was left, and I see no follow up after that. The patient has no recollection of higher dosing or continuous treatment.  The patient was transfused 3 units. Post-transfusion H/H 7.1 g/dL.  Ferritin 2 ng/mL and so was given Feraheme as well.  Post-transfusion, she was comfortable walking, taking PO and so she was discharged to home.  She has follow up with [facility] on Monday Nov 4.        Discharge Instructions  Discharge Instructions    Diet general    Complete by:  As directed    Discharge instructions   Complete by:  As directed    From Dr. Loleta Books: You were admitted with anemia from your fibroids. You should take Provera 20 mg (2 tabs) daily starting today. You have an appointment at 10:35AM on Monday 11/4 at [facility].   This is an add on appointment with Dr. Nehemiah Settle, who works with Dr. Rosana Hoes.   Ask him how to take Provera going forward.    As for your anemia:  Your hemoglobin (which is what we use to measure blood level) was 4.1 g/dL, which is about a third of what it should be. Because of your blood loss, you have lost iron. Start taking iron (ferrous sulfate) 325 mg two times per day Take with orange juice or something acidic like that. Take Colace daily to prevent constipation   Increase activity slowly   Complete by:  As directed      Allergies as of 07/07/2018   No Known Allergies     Medication List    STOP taking these medications   ibuprofen 200 MG tablet Commonly known as:  ADVIL,MOTRIN     TAKE these medications   ferrous sulfate 325 (65 FE) MG tablet Take 1 tablet (325 mg total) by mouth 2 (two) times daily with a meal.   medroxyPROGESTERone 10 MG tablet Commonly known as:  PROVERA Take 2 tablets (20 mg total) by mouth daily. What changed:    how  much to take  additional instructions      Follow-up Information    [place] Follow up.   Why:  OR this can become your primary doctor without insurance.  Once you make an appointment at 1 of these clinics, please schedule a FINANCIAL COUNSELING appointment here and use this location for your PHARMACY needs. Contact information: [place] [phone] [place] Follow up.   Why:  OR this can become your primary doctor without insurance. Contact information: [place]  [phone] [place] Follow up.   Specialty:  Internal Medicine Why:  OR this can become your primary doctor without insurance. Contact information: [place] [phone]         No Known Allergies  Consultations:  OBGyn by phone   Procedures/Studies: US Pelvic Complete With Transvaginal  Result Date: 07/07/2018 CLINICAL DATA:  Initial evaluation for acute vaginal bleeding. History of fibroids. EXAM: TRANSABDOMINAL AND TRANSVAGINAL ULTRASOUND OF PELVIS TECHNIQUE: Both transabdominal and transvaginal ultrasound examinations of the pelvis were performed. Transabdominal technique was performed for global imaging of the pelvis including uterus, ovaries, adnexal regions, and pelvic cul-de-sac. It was necessary to proceed with endovaginal exam following the transabdominal exam to visualize the uterus, endometrium, and ovaries. COMPARISON:  Prior ultrasound from 01/18/2018 FINDINGS: Uterus Measurements: 13.7 x 6.2 x 4.0 cm. Multiple uterine fibroids are seen as follows. 3.6 x 3.4 x 3.4 cm subserosal fibroid present at the left mid uterine body. 3.4 x 3.4 x 3.9 cm sub mucosal fibroid present at the central uterine fundus. 2.0 x 2.0 x 3.5 cm intramural fibroid at the right mid uterine body. 4.5 x 4.8 x 4.0 cm exophytic fibroid extends from the uterine fundus. Endometrium Thickness: 5 mm.  No focal abnormality visualized. Right ovary Measurements: 2.9 x 1.3 x 2.7 cm. Normal appearance/no adnexal mass. Left ovary Measurements: 4.1 x 2.5 x 2.4 cm. Normal appearance/no adnexal mass. Other findings No abnormal free fluid. IMPRESSION: 1. Enlarged fibroid uterus as detailed above. 2. Endometrial stripe within normal limits measuring 5 mm in thickness. If bleeding remains unresponsive to hormonal or medical therapy, sonohysterogram should be considered for focal lesion work-up. (Ref: Radiological Reasoning: Algorithmic  Workup of Abnormal Vaginal Bleeding with Endovaginal Sonography and Sonohysterography. AJR 2008; [id number]:[id number]). 3. No other acute abnormality within the pelvis. Normal sonographic appearance of the ovaries. Electronically Signed   By: Jeannine Boga M.D.   On: 07/07/2018 04:30       Subjective: Feeling better post-transfusion.  No dizziness, lightheadedness, SOB, chest pain.  Discharge Exam: Vitals:   07/07/18 1600 07/07/18 1829  BP: 138/82 127/81  Pulse: 65 68  Resp: 19 18  Temp:    SpO2: 99% 100%   Vitals:   07/07/18 1300 07/07/18 1530 07/07/18 1600 07/07/18 1829  BP: 118/72 (!) 141/74 138/82 127/81  Pulse: 65 76 65 68  Resp: 19 20 19 18   Temp:      TempSrc:      SpO2: 100% 99% 99% 100%  Weight:      Height:        General: Pt is alert, awake, not in acute distress Cardiovascular: RRR, nl S1-S2, soft flow murmur noted.   No LE edema.   Respiratory: Normal respiratory rate and rhythm.  CTAB without rales  or wheezes. Abdominal: Abdomen soft and non-tender.  No distension or HSM.   Neuro/Psych: Strength symmetric in upper and lower extremities.  Judgment and insight appear normal.   The results of significant diagnostics from this hospitalization (including imaging, microbiology, ancillary and laboratory) are listed below for reference.     Microbiology: Recent Results (from the past 240 hour(s))  Wet prep, genital     Status: Abnormal   Collection Time: 07/06/18  9:00 PM  Result Value Ref Range Status   Yeast Wet Prep HPF POC NONE SEEN NONE SEEN Final   Trich, Wet Prep NONE SEEN NONE SEEN Final   Clue Cells Wet Prep HPF POC NONE SEEN NONE SEEN Final   WBC, Wet Prep HPF POC FEW (A) NONE SEEN Final   Sperm NONE SEEN  Final    Comment: Performed at [facility], [place] [address] [place], [place] [address]     Labs: BNP (last 3 results) No results for input(s): BNP in the last 8760 hours. Basic Metabolic Panel: Recent Labs  Lab  07/06/18 2147  NA 140  K 3.6  CL 108  CO2 26  GLUCOSE 103*  BUN 11  CREATININE 0.71  CALCIUM 9.1   Liver Function Tests: No results for input(s): AST, ALT, ALKPHOS, BILITOT, PROT, ALBUMIN in the last 168 hours. No results for input(s): LIPASE, AMYLASE in the last 168 hours. No results for input(s): AMMONIA in the last 168 hours. CBC: Recent Labs  Lab 07/06/18 2030 07/07/18 1158  WBC 4.9 4.5  HGB 4.1* 7.1*  HCT 16.1* 24.0*  MCV 60.3* 69.2*  PLT 198 153   Cardiac Enzymes: No results for input(s): CKTOTAL, CKMB, CKMBINDEX, TROPONINI in the last 168 hours. BNP: Invalid input(s): POCBNP CBG: No results for input(s): GLUCAP in the last 168 hours. D-Dimer No results for input(s): DDIMER in the last 72 hours. Hgb A1c No results for input(s): HGBA1C in the last 72 hours. Lipid Profile No results for input(s): CHOL, HDL, LDLCALC, TRIG, CHOLHDL, LDLDIRECT in the last 72 hours. Thyroid function studies No results for input(s): TSH, T4TOTAL, T3FREE, THYROIDAB in the last 72 hours.  Invalid input(s): FREET3 Anemia work up Recent Labs    07/07/18 1158  FERRITIN 2*   Urinalysis    Component Value Date/Time   COLORURINE RED (A) 01/18/2018 0213   APPEARANCEUR CLOUDY (A) 01/18/2018 0213   LABSPEC 1.020 01/18/2018 0213   PHURINE 7.5 01/18/2018 0213   GLUCOSEU NEGATIVE 01/18/2018 0213   HGBUR LARGE (A) 01/18/2018 0213   BILIRUBINUR NEGATIVE 01/18/2018 0213   KETONESUR NEGATIVE 01/18/2018 0213   PROTEINUR >300 (A) 01/18/2018 0213   UROBILINOGEN 0.2 11/30/2012 1828   NITRITE NEGATIVE 01/18/2018 0213   LEUKOCYTESUR TRACE (A) 01/18/2018 0213   Sepsis Labs Invalid input(s): PROCALCITONIN,  WBC,  LACTICIDVEN Microbiology Recent Results (from the past 240 hour(s))  Wet prep, genital     Status: Abnormal   Collection Time: 07/06/18  9:00 PM  Result Value Ref Range Status   Yeast Wet Prep HPF POC NONE SEEN NONE SEEN Final   Trich, Wet Prep NONE SEEN NONE SEEN Final   Clue  Cells Wet Prep HPF POC NONE SEEN NONE SEEN Final   WBC, Wet Prep HPF POC FEW (A) NONE SEEN Final   Sperm NONE SEEN  Final    Comment: Performed at [facility], [place] [address], [place], [place] [address]     Time coordinating discharge: 30 minutes      SIGNED:  Edwin Dada, MD  [facility] 07/07/2018, 6:08 PM

## 2018-07-12 ENCOUNTER — Encounter: Payer: Self-pay | Admitting: Family Medicine

## 2018-07-12 ENCOUNTER — Ambulatory Visit (INDEPENDENT_AMBULATORY_CARE_PROVIDER_SITE_OTHER): Payer: Self-pay | Admitting: Family Medicine

## 2018-07-12 ENCOUNTER — Telehealth: Payer: Self-pay

## 2018-07-12 VITALS — BP 133/75 | HR 72 | Ht 64.0 in | Wt 145.2 lb

## 2018-07-12 DIAGNOSIS — D259 Leiomyoma of uterus, unspecified: Secondary | ICD-10-CM

## 2018-07-12 DIAGNOSIS — N939 Abnormal uterine and vaginal bleeding, unspecified: Secondary | ICD-10-CM

## 2018-07-12 DIAGNOSIS — D500 Iron deficiency anemia secondary to blood loss (chronic): Secondary | ICD-10-CM

## 2018-07-12 MED ORDER — MEDROXYPROGESTERONE ACETATE 10 MG PO TABS: 20.0000 mg | ORAL_TABLET | Freq: Every day | ORAL | 3 refills | Status: DC

## 2018-07-12 NOTE — Telephone Encounter (Signed)
Message received from Haze Justin, RN CM requesting a hospital follow up appointment for the patient at [facility]. Call placed to the patient and offered her an appointment at [facility].  She said that she had an appointment with her GYN and took the phone # for [facility] to schedule an appointment when she is ready. She did not want to schedule an appointment today.  Update provided to Orpha Bur, RN CM

## 2018-07-12 NOTE — Progress Notes (Signed)
   Subjective:    Patient ID: Tasha Payne, female    DOB: [birth year], 47 y.o.   MRN: [id number]  HPI Patient seen for follow up of AUB, anemia, uterine fibroids. She was admitted last week due to significant anemia. She had a blood transfusion, as well as a dose of feraheme. She feels much better today. She is currently taking provera 20mg  daily. Currently having mild spotting.   Review of Systems     Objective:   Physical Exam  Constitutional: She is oriented to person, place, and time. She appears well-developed and well-nourished.  Cardiovascular: Normal rate and regular rhythm.  Pulmonary/Chest: Effort normal and breath sounds normal.  Neurological: She is alert and oriented to person, place, and time.  Skin: Skin is warm and dry.       Assessment & Plan:  1. Uterine leiomyoma, unspecified location 2. Abnormal uterine bleeding (AUB) 3. Iron deficiency anemia due to chronic blood loss Continue provera. Pt to follow up with Dr Rosana Hoes for more definitive treatment.

## 2018-08-16 ENCOUNTER — Ambulatory Visit (INDEPENDENT_AMBULATORY_CARE_PROVIDER_SITE_OTHER): Payer: Self-pay | Admitting: Obstetrics and Gynecology

## 2018-08-16 ENCOUNTER — Encounter: Payer: Self-pay | Admitting: Obstetrics and Gynecology

## 2018-08-16 VITALS — BP 145/94 | HR 80 | Wt 149.5 lb

## 2018-08-16 DIAGNOSIS — Z124 Encounter for screening for malignant neoplasm of cervix: Secondary | ICD-10-CM

## 2018-08-16 DIAGNOSIS — R030 Elevated blood-pressure reading, without diagnosis of hypertension: Secondary | ICD-10-CM

## 2018-08-16 DIAGNOSIS — N939 Abnormal uterine and vaginal bleeding, unspecified: Secondary | ICD-10-CM

## 2018-08-16 DIAGNOSIS — Z1151 Encounter for screening for human papillomavirus (HPV): Secondary | ICD-10-CM

## 2018-08-16 DIAGNOSIS — Z113 Encounter for screening for infections with a predominantly sexual mode of transmission: Secondary | ICD-10-CM

## 2018-08-16 NOTE — Progress Notes (Signed)
GYNECOLOGY OFFICE FOLLOW UP NOTE  History:  47 y.o. [id number] here today for follow up for AUB. Taking provera 20 mg daily and is not having any further bleeding. She is here to discuss definitive treatment for AUB. Two admissions for blood transfusion in the past year for anemia secondary to heavy menses.  Past Medical History:  Diagnosis Date  . Fibroid   . Medical history non-contributory    Past Surgical History:  Procedure Laterality Date  . TUBAL LIGATION  1996    Current Outpatient Medications:  .  ferrous sulfate (IRON SUPPLEMENT) 325 (65 FE) MG tablet, Take 1 tablet (325 mg total) by mouth 2 (two) times daily with a meal., Disp: 60 tablet, Rfl: 3 .  medroxyPROGESTERone (PROVERA) 10 MG tablet, Take 2 tablets (20 mg total) by mouth daily., Disp: 60 tablet, Rfl: 3  The following portions of the patient's history were reviewed and updated as appropriate: allergies, current medications, past family history, past medical history, past social history, past surgical history and problem list.   Review of Systems:  Pertinent items noted in HPI and remainder of comprehensive ROS otherwise negative.   Objective:  Physical Exam BP (!) 145/94   Pulse 80   Wt 149 lb 8 oz (67.8 kg)   BMI 25.66 kg/m  CONSTITUTIONAL: Well-developed, well-nourished female in no acute distress.  HENT:  Normocephalic, atraumatic. External right and left ear normal. Oropharynx is clear and moist EYES: Conjunctivae and EOM are normal. Pupils are equal, round, and reactive to light. No scleral icterus.  NECK: Normal range of motion, supple, no masses SKIN: Skin is warm and dry. No rash noted. Not diaphoretic. No erythema. No pallor. NEUROLOGIC: Alert and oriented to person, place, and time. Normal reflexes, muscle tone coordination. No cranial nerve deficit noted. PSYCHIATRIC: Normal mood and affect. Normal behavior. Normal judgment and thought content. CARDIOVASCULAR: Normal heart rate noted RESPIRATORY:  Effort normal, no problems with respiration noted ABDOMEN: Soft, no distention noted.   PELVIC: Normal appearing external genitalia; normal appearing vaginal mucosa and cervix.  No abnormal discharge noted.  Pap smear obtained.  Normal uterine size, no other palpable masses, no uterine or adnexal tenderness. MUSCULOSKELETAL: Normal range of motion. No edema noted.  Labs and Imaging No results found.  Assessment & Plan:   1. Abnormal uterine bleeding (AUB) - Patient has been doing well on provera 20 mg daily without bleeding, however would like definitive management. With her multiple transfusions over the last year for symptomatic anemia 2/2 abnormal uterine bleeding, as well as multiple 2-4 cm fibroids, would recommend hysterectomy for definitive treatment. She is agreeable to this plan and eager for definitive treatment. - Reviewed recommendation for vaginal hysterectomy, given mobile uterus. The risks of vaginal hysterectomy were discussed with the patient; including but not limited to: infection which may require antibiotics; bleeding which may require transfusion or re-operation; injury to bowel, bladder, ureters or other surrounding organs; need for additional procedures, conversion to laparotomy, incisional problems, thromboembolic phenomenon and other postoperative/anesthesia complications. Reviewed that with a hysterectomy, she will not be able to have children in the future. Reviewed recommendation to remove fallopian tubes to reduce risk of ovarian cancer, but as she is 88, would not recommend removal of ovaries at this time. Reviewed that ovaries may be removed if they are abnormal appearing, anatomy requires it or if there is damage to blood supply. Reviewed that removal of ovaries would put her into surgical menopause and we will attempt to avoid this if  possible. Reviewed expected post surgery course and hospital stay.  Patient verbalized understanding of the above and consents to blood  transfusion in the event of a life-threatening hemorrhage. Answered all questions.  - needs EMB prior to hysterectomy  2. Routine Papanicolaou smear - Cytology - PAP( [place])  3. Routine screening for STI (sexually transmitted infection) Agreeable to GC/CT, HIV, RPR, Hep B&C  4. Elevated blood pressure reading in office without diagnosis of hypertension To f/u with PCP   Routine preventative health maintenance measures emphasized. Please refer to After Visit Summary for other counseling recommendations.   No follow-ups on file.    Feliz Beam, M.D. Attending Center for [employer] farm manager)

## 2018-08-18 LAB — CYTOLOGY - PAP
CHLAMYDIA, DNA PROBE: NEGATIVE
Diagnosis: NEGATIVE
HPV: NOT DETECTED
Neisseria Gonorrhea: NEGATIVE
TRICH (WINDOWPATH): NEGATIVE

## 2018-08-20 ENCOUNTER — Encounter (HOSPITAL_COMMUNITY): Payer: Self-pay

## 2018-09-13 ENCOUNTER — Telehealth: Payer: Self-pay | Admitting: Family Medicine

## 2018-09-13 NOTE — Telephone Encounter (Signed)
Attempted to contact pt about flu restrictions at the hospital. No answer and no VM to leave message

## 2018-09-15 ENCOUNTER — Encounter: Payer: Self-pay | Admitting: Family Medicine

## 2018-09-15 ENCOUNTER — Ambulatory Visit: Payer: Self-pay | Admitting: Obstetrics and Gynecology

## 2018-09-20 ENCOUNTER — Encounter (HOSPITAL_COMMUNITY): Payer: Self-pay

## 2018-10-04 ENCOUNTER — Other Ambulatory Visit: Payer: Self-pay | Admitting: Family Medicine

## 2018-10-06 ENCOUNTER — Telehealth: Payer: Self-pay | Admitting: General Practice

## 2018-10-06 NOTE — Telephone Encounter (Signed)
-----   Message from Tasha Payne sent at 09/29/2018  2:24 PM EST ----- Regarding: Needs Hysterectomy Statement Please have this patient come in and sign a hysterectomy statement, her surgery is scheduled for 03/31

## 2018-10-06 NOTE — Telephone Encounter (Signed)
Called patient, no answer- left message stating we are trying to reach you regarding a need to come in and complete a form before your next appointment, please come by anytime during office hours to complete that.

## 2018-10-07 NOTE — Telephone Encounter (Signed)
I called Tasha Payne to notify her to come by office during office hours to sign hysterectomy statement. She states she got the message yesterday.  I reviewed office hours with her and she states she understands.

## 2018-10-11 ENCOUNTER — Telehealth: Payer: Self-pay | Admitting: Advanced Practice Midwife

## 2018-10-11 NOTE — Telephone Encounter (Signed)
Left a message informing the patient to please contact our office. Scheduled an upcoming appointment for March as well as sending a reminder letter.

## 2018-10-15 ENCOUNTER — Ambulatory Visit: Payer: Self-pay | Admitting: Obstetrics and Gynecology

## 2018-10-21 ENCOUNTER — Telehealth: Payer: Self-pay | Admitting: Obstetrics and Gynecology

## 2018-10-21 NOTE — Telephone Encounter (Signed)
Requesting call from nurse.

## 2018-10-21 NOTE — Telephone Encounter (Signed)
I called Paul back and she informed me that her work is not going to give her the short term disability time off until she has been there one year which is July. She wants to know if her surgery can be delayed until then or if she needs to have it as scheduled. I informed her I will send a message to provider and let her know and we will get back to her; but in most cases can be delayed.

## 2018-10-22 NOTE — Telephone Encounter (Signed)
I called Traci and notified her Dr. Rosana Hoes said surgery can be rescheduled and they will notify her of date and also that July schedule not out so can't pick date yet. She voices understanding.

## 2018-11-08 ENCOUNTER — Ambulatory Visit: Payer: Self-pay | Admitting: Obstetrics and Gynecology

## 2018-11-10 ENCOUNTER — Telehealth: Payer: Self-pay | Admitting: *Deleted

## 2018-11-10 NOTE — Telephone Encounter (Signed)
Received a voice message this pm that she is calling re: disability claim- needs to confirm if she had her hysterectomy on 11/08/18 or if it was postponed. Asks for a call back at [phone] ext [phone]

## 2018-12-07 ENCOUNTER — Ambulatory Visit (HOSPITAL_COMMUNITY): Admit: 2018-12-07 | Payer: MEDICAID | Admitting: Obstetrics and Gynecology

## 2018-12-07 SURGERY — HYSTERECTOMY, VAGINAL
Anesthesia: Choice | Laterality: Bilateral

## 2018-12-09 ENCOUNTER — Telehealth: Payer: Self-pay | Admitting: *Deleted

## 2018-12-09 NOTE — Telephone Encounter (Addendum)
Pt left message for Dr. Rosana Hoes stating she would like a refill of "these pills". Pt did not state name of medication. I returned pt's call and discussed her request. She wanted to be sure she would be able to get additional provera when current prescription runs out so she would not start bleeding again. I advised pt that she has one refill available. Pt voiced understanding and had no further questions.

## 2018-12-14 ENCOUNTER — Ambulatory Visit (HOSPITAL_BASED_OUTPATIENT_CLINIC_OR_DEPARTMENT_OTHER): Admit: 2018-12-14 | Payer: 59 | Admitting: Obstetrics and Gynecology

## 2018-12-14 ENCOUNTER — Encounter (HOSPITAL_BASED_OUTPATIENT_CLINIC_OR_DEPARTMENT_OTHER): Payer: Self-pay

## 2018-12-14 SURGERY — HYSTERECTOMY, VAGINAL
Anesthesia: Choice

## 2019-01-13 ENCOUNTER — Encounter: Payer: Self-pay | Admitting: Obstetrics and Gynecology

## 2019-02-09 ENCOUNTER — Telehealth: Payer: Self-pay

## 2019-02-09 NOTE — Telephone Encounter (Signed)
Pt called and stated that she is scheduled with Dr. Rosana Hoes for a hysterectomy and she was just furloughed from job and needs her surgery to be scheduled by the end of this month due to her not having insurance.  Re: Per Drema Balzarine, pt can not get rescheduled due to her being rescheduled three times.

## 2019-02-10 NOTE — Telephone Encounter (Signed)
Called pt and informed pt that unfortuately we were not going to be able to reschedule her surgery due to the schedule being booked and that she has been rescheduled three times.  I informed pt that we can send to her in the mail [place] paperwork so that she can hopefully get approved for her surgery.  Pt stated understanding.  [place] office notified to mail her the financial assistance paperwork.

## 2019-02-23 ENCOUNTER — Ambulatory Visit: Payer: 59 | Admitting: Obstetrics and Gynecology

## 2019-03-01 ENCOUNTER — Other Ambulatory Visit: Payer: Self-pay

## 2019-03-01 ENCOUNTER — Encounter (HOSPITAL_BASED_OUTPATIENT_CLINIC_OR_DEPARTMENT_OTHER): Payer: Self-pay | Admitting: *Deleted

## 2019-03-02 ENCOUNTER — Ambulatory Visit (INDEPENDENT_AMBULATORY_CARE_PROVIDER_SITE_OTHER): Payer: 59 | Admitting: Obstetrics and Gynecology

## 2019-03-02 ENCOUNTER — Encounter: Payer: Self-pay | Admitting: Obstetrics and Gynecology

## 2019-03-02 ENCOUNTER — Other Ambulatory Visit (HOSPITAL_COMMUNITY)
Admission: RE | Admit: 2019-03-02 | Discharge: 2019-03-02 | Disposition: A | Discharge status: Home / Self Care | Payer: 59 | Source: Ambulatory Visit | Attending: Obstetrics and Gynecology | Admitting: Obstetrics and Gynecology

## 2019-03-02 VITALS — BP 134/91 | HR 85 | Wt 159.0 lb

## 2019-03-02 DIAGNOSIS — D259 Leiomyoma of uterus, unspecified: Secondary | ICD-10-CM | POA: Diagnosis not present

## 2019-03-02 DIAGNOSIS — N939 Abnormal uterine and vaginal bleeding, unspecified: Secondary | ICD-10-CM | POA: Insufficient documentation

## 2019-03-02 LAB — POCT PREGNANCY, URINE: Preg Test, Ur: NEGATIVE

## 2019-03-02 NOTE — Progress Notes (Signed)
GYNECOLOGY OFFICE FOLLOW UP NOTE  History:  48 y.o. [id number] here today for follow up for AUB. Has been on provera for bleeding.  Having irregular periods, much lighter than before. Occasional spotting. She is ready for definitive management of her abnormal bleeding.   Past Medical History:  Diagnosis Date  . Anemia   . Fibroid   . Medical history non-contributory    Past Surgical History:  Procedure Laterality Date  . TUBAL LIGATION  1996    Current Outpatient Medications:  .  ferrous sulfate (IRON SUPPLEMENT) 325 (65 FE) MG tablet, Take 1 tablet (325 mg total) by mouth 2 (two) times daily with a meal., Disp: 60 tablet, Rfl: 3 .  medroxyPROGESTERone (PROVERA) 10 MG tablet, TAKE 2 TABLETS BY MOUTH EVERY DAY, Disp: 180 tablet, Rfl: 1  The following portions of the patient's history were reviewed and updated as appropriate: allergies, current medications, past family history, past medical history, past social history, past surgical history and problem list.   Review of Systems:  Pertinent items noted in HPI and remainder of comprehensive ROS otherwise negative.   Objective:  Physical Exam Wt 159 lb (72.1 kg)   LMP 02/02/2019 (Approximate)   BMI 27.29 kg/m  CONSTITUTIONAL: Well-developed, well-nourished female in no acute distress.  HENT:  Normocephalic, atraumatic. External right and left ear normal. Oropharynx is clear and moist EYES: Conjunctivae and EOM are normal. Pupils are equal, round, and reactive to light. No scleral icterus.  NECK: Normal range of motion, supple, no masses SKIN: Skin is warm and dry. No rash noted. Not diaphoretic. No erythema. No pallor. NEUROLOGIC: Alert and oriented to person, place, and time. Normal reflexes, muscle tone coordination. No cranial nerve deficit noted. PSYCHIATRIC: Normal mood and affect. Normal behavior. Normal judgment and thought content. CARDIOVASCULAR: Normal heart rate noted RESPIRATORY: Effort normal, no problems with  respiration noted ABDOMEN: Soft, no distention noted.   PELVIC: normal appearing external genitalia; normal appearing vaginal mucosa and cervix.  No abnormal discharge noted.  Normal uterine size, no other palpable masses, no uterine or adnexal tenderness. EMB done, please see separate note MUSCULOSKELETAL: Normal range of motion. No edema noted.  Labs and Imaging No results found.  Assessment & Plan:   1. Abnormal uterine bleeding (AUB) Reviewed need for EMB, low likelihood that AUB is caused by malignancy, however if malignancy found, would not proceed with surgery and she would be referred to [facility].  - please see separate EMB note - Surgical pathology( [place]/ [facility]) - Given enlarged uterus on exam, recommended to proceed with laparoscopic hysterectomy. Reviewed recommendation for total laparoscopic hysterectomy, given enlarged multiple fibroid uterus, largest fibroid 3.6 cm in diameter. The risks of laparoscopic hysterectomy were discussed with the patient; including but not limited to: infection which may require antibiotics; bleeding which may require transfusion or re-operation; injury to bowel, bladder, ureters or other surrounding organs; need for additional procedures, conversion to laparotomy, incisional problems, thromboembolic phenomenon and other postoperative/anesthesia complications. Reviewed that with a hysterectomy, she will not be able to have children in the future. Reviewed recommendation to remove fallopian tubes to reduce risk of ovarian cancer, but as she is 69, would not recommend removal of ovaries at this time. Reviewed that ovaries may be removed if they are abnormal appearing, anatomy requires it or if there is damage to blood supply. Reviewed that removal of ovaries would put her into surgical menopause and we will attempt to avoid this if possible. Reviewed expected post surgery course  and hospital stay.  Patient verbalized understanding of the above and  consents to blood transfusion in the event of a life-threatening hemorrhage. Answered all questions.  - She understands she will have COVID testing and surgery cancelled if she is positive  2. Uterine leiomyoma, unspecified location See above   Routine preventative health maintenance measures emphasized. Please refer to After Visit Summary for other counseling recommendations.   No follow-ups on file.  Total face-to-face time with patient: 30 minutes. Over 50% of encounter was spent on counseling and coordination of care.  Feliz Beam, M.D. Attending Center for [employer] Fish farm manager)

## 2019-03-02 NOTE — Progress Notes (Signed)
ENDOMETRIAL BIOPSY      Tasha Payne is a 48 y.o. [phone] here for endometrial biopsy.  The indications for endometrial biopsy were reviewed.  Risks of the biopsy including cramping, bleeding, infection, uterine perforation, inadequate specimen and need for additional procedures were discussed. The patient states she understands and agrees to undergo procedure today. Consent was signed. Time out was performed.   Indications: AUB Urine HCG: negative  A bivalve speculum was placed into the vagina and the cervix was easily visualized and was prepped with Betadine x2. A single-toothed tenaculum was placed on the anterior lip of the cervix to stabilize it. The 3 mm pipelle was introduced into the endometrial cavity without difficulty to a depth of 10 cm, and a moderate amount of tissue was obtained and sent to pathology. This was repeated for a total of 3 passes. The instruments were removed from the patient's vagina. Minimal bleeding from the cervix at the tenaculum was noted.   The patient tolerated the procedure well. Routine post-procedure instructions were given to the patient.    Will base further management on results of biopsy.  Feliz Beam, M.D. Attending Center for [employer] Fish farm manager)

## 2019-03-04 ENCOUNTER — Encounter: Payer: Self-pay | Admitting: Obstetrics and Gynecology

## 2019-03-05 ENCOUNTER — Other Ambulatory Visit (HOSPITAL_COMMUNITY)
Admission: RE | Admit: 2019-03-05 | Discharge: 2019-03-05 | Disposition: A | Discharge status: Home / Self Care | Payer: 59 | Source: Ambulatory Visit | Attending: Obstetrics and Gynecology | Admitting: Obstetrics and Gynecology

## 2019-03-05 DIAGNOSIS — Z1159 Encounter for screening for other viral diseases: Secondary | ICD-10-CM | POA: Diagnosis not present

## 2019-03-05 LAB — SARS CORONAVIRUS 2 (TAT 6-24 HRS): SARS Coronavirus 2: NEGATIVE

## 2019-03-07 ENCOUNTER — Other Ambulatory Visit (HOSPITAL_COMMUNITY): Payer: 59

## 2019-03-08 ENCOUNTER — Encounter: Payer: Self-pay | Admitting: Obstetrics and Gynecology

## 2019-03-08 ENCOUNTER — Encounter (HOSPITAL_BASED_OUTPATIENT_CLINIC_OR_DEPARTMENT_OTHER)
Admission: RE | Admit: 2019-03-08 | Discharge: 2019-03-08 | Disposition: A | Discharge status: Home / Self Care | Payer: 59 | Source: Ambulatory Visit | Attending: Obstetrics and Gynecology | Admitting: Obstetrics and Gynecology

## 2019-03-08 ENCOUNTER — Other Ambulatory Visit: Payer: Self-pay

## 2019-03-08 ENCOUNTER — Telehealth: Payer: 59 | Admitting: Obstetrics and Gynecology

## 2019-03-08 LAB — CBC
HCT: 42.5 % (ref 36.0–46.0)
Hemoglobin: 14.4 g/dL (ref 12.0–15.0)
MCH: 28.4 pg (ref 26.0–34.0)
MCHC: 33.9 g/dL (ref 30.0–36.0)
MCV: 83.8 fL (ref 80.0–100.0)
Platelets: 211 10*3/uL (ref 150–400)
RBC: 5.07 MIL/uL (ref 3.87–5.11)
RDW: 13 % (ref 11.5–15.5)
WBC: 6 10*3/uL (ref 4.0–10.5)
nRBC: 0 % (ref 0.0–0.2)

## 2019-03-08 LAB — BASIC METABOLIC PANEL
Anion gap: 8 (ref 5–15)
BUN: 8 mg/dL (ref 6–20)
CO2: 26 mmol/L (ref 22–32)
Calcium: 9.2 mg/dL (ref 8.9–10.3)
Chloride: 108 mmol/L (ref 98–111)
Creatinine, Ser: 0.91 mg/dL (ref 0.44–1.00)
GFR calc Af Amer: >60 mL/min (ref >60)
GFR calc non Af Amer: >60 mL/min (ref >60)
Glucose, Bld: 104 mg/dL — ABNORMAL HIGH (ref 70–99)
Potassium: 3.9 mmol/L (ref 3.5–5.1)
Sodium: 142 mmol/L (ref 135–145)

## 2019-03-08 LAB — HCG, QUANTITATIVE, PREGNANCY: hCG, Beta Chain, Quant, S: <1 m[IU]/mL (ref <5)

## 2019-03-08 LAB — ABO/RH: ABO/RH(D): A POS

## 2019-03-08 NOTE — Anesthesia Preprocedure Evaluation (Addendum)
Anesthesia Evaluation  Patient identified by MRN, date of birth, ID band Patient awake    Reviewed: Allergy & Precautions, NPO status , Patient's Chart, lab work & pertinent test results  Airway Mallampati: II  TM Distance: >3 FB Neck ROM: Full    Dental no notable dental hx. (+) Teeth Intact   Pulmonary neg pulmonary ROS,    Pulmonary exam normal breath sounds clear to auscultation       Cardiovascular Exercise Tolerance: Good negative cardio ROS Normal cardiovascular exam Rhythm:Regular Rate:Normal     Neuro/Psych negative psych ROS   GI/Hepatic negative GI ROS, Neg liver ROS,   Endo/Other  negative endocrine ROS  Renal/GU negative Renal ROS     Musculoskeletal   Abdominal   Peds  Hematology  (+) anemia ,   Anesthesia Other Findings   Reproductive/Obstetrics                            Anesthesia Physical Anesthesia Plan  ASA: I  Anesthesia Plan: General   Post-op Pain Management:    Induction: Intravenous  PONV Risk Score and Plan: 4 or greater and Treatment may vary due to age or medical condition, Dexamethasone, Ondansetron, Midazolam and Scopolamine patch - Pre-op  Airway Management Planned: Oral ETT  Additional Equipment:   Intra-op Plan:   Post-operative Plan: Extubation in OR  Informed Consent:   Plan Discussed with: CRNA  Anesthesia Plan Comments:         Anesthesia Quick Evaluation

## 2019-03-08 NOTE — Progress Notes (Unsigned)
Patient ID: Tasha Payne, female   DOB: 1971/08/25, 48 y.o.   MRN: [id number]   Spoke with patient about getting information off of her insurance card so that I can get her surgery preauthed for her 7/1 surgery. Patient stated that she no longer has [employer]. Patient stated that she was told that she would be able to apply for financial assistance after the surgery was completed. I spoke with Norval Gable from pre-service center and gave her this information. Tasha Payne stated that she was the one who let the patient know that she could apply for financial assistance after the surgery, but she had the understanding that the patient still had [employer].

## 2019-03-09 ENCOUNTER — Ambulatory Visit (HOSPITAL_BASED_OUTPATIENT_CLINIC_OR_DEPARTMENT_OTHER): Payer: 59 | Admitting: Anesthesiology

## 2019-03-09 ENCOUNTER — Encounter (HOSPITAL_BASED_OUTPATIENT_CLINIC_OR_DEPARTMENT_OTHER): Payer: Self-pay | Admitting: *Deleted

## 2019-03-09 ENCOUNTER — Encounter (HOSPITAL_COMMUNITY)
Admission: RE | Disposition: A | Discharge status: Home / Self Care | Payer: Self-pay | Source: Home / Self Care | Attending: Obstetrics and Gynecology

## 2019-03-09 ENCOUNTER — Inpatient Hospital Stay (HOSPITAL_BASED_OUTPATIENT_CLINIC_OR_DEPARTMENT_OTHER)
Admission: RE | Admit: 2019-03-09 | Discharge: 2019-03-15 | DRG: 742 | Disposition: A | Discharge status: Home Health | Payer: 59 | Attending: Obstetrics and Gynecology | Admitting: Obstetrics and Gynecology

## 2019-03-09 ENCOUNTER — Ambulatory Visit (HOSPITAL_COMMUNITY): Payer: 59

## 2019-03-09 DIAGNOSIS — K9181 Other intraoperative complications of digestive system: Secondary | ICD-10-CM | POA: Diagnosis not present

## 2019-03-09 DIAGNOSIS — E876 Hypokalemia: Secondary | ICD-10-CM | POA: Diagnosis not present

## 2019-03-09 DIAGNOSIS — D500 Iron deficiency anemia secondary to blood loss (chronic): Secondary | ICD-10-CM | POA: Diagnosis present

## 2019-03-09 DIAGNOSIS — N920 Excessive and frequent menstruation with regular cycle: Principal | ICD-10-CM | POA: Diagnosis present

## 2019-03-09 DIAGNOSIS — N823 Fistula of vagina to large intestine: Secondary | ICD-10-CM | POA: Diagnosis not present

## 2019-03-09 DIAGNOSIS — S3660XA Unspecified injury of rectum, initial encounter: Secondary | ICD-10-CM | POA: Diagnosis not present

## 2019-03-09 DIAGNOSIS — S3142XA Laceration with foreign body of vagina and vulva, initial encounter: Secondary | ICD-10-CM | POA: Diagnosis not present

## 2019-03-09 DIAGNOSIS — Z833 Family history of diabetes mellitus: Secondary | ICD-10-CM | POA: Diagnosis not present

## 2019-03-09 DIAGNOSIS — Y763 Surgical instruments, materials and obstetric and gynecological devices (including sutures) associated with adverse incidents: Secondary | ICD-10-CM | POA: Diagnosis not present

## 2019-03-09 DIAGNOSIS — Z79899 Other long term (current) drug therapy: Secondary | ICD-10-CM

## 2019-03-09 DIAGNOSIS — N83201 Unspecified ovarian cyst, right side: Secondary | ICD-10-CM | POA: Diagnosis present

## 2019-03-09 DIAGNOSIS — Z419 Encounter for procedure for purposes other than remedying health state, unspecified: Secondary | ICD-10-CM

## 2019-03-09 DIAGNOSIS — S3663XA Laceration of rectum, initial encounter: Secondary | ICD-10-CM | POA: Diagnosis not present

## 2019-03-09 DIAGNOSIS — D259 Leiomyoma of uterus, unspecified: Secondary | ICD-10-CM | POA: Diagnosis present

## 2019-03-09 DIAGNOSIS — Z933 Colostomy status: Secondary | ICD-10-CM

## 2019-03-09 DIAGNOSIS — N939 Abnormal uterine and vaginal bleeding, unspecified: Secondary | ICD-10-CM | POA: Diagnosis not present

## 2019-03-09 DIAGNOSIS — Z90710 Acquired absence of both cervix and uterus: Secondary | ICD-10-CM | POA: Diagnosis present

## 2019-03-09 DIAGNOSIS — T819XXA Unspecified complication of procedure, initial encounter: Secondary | ICD-10-CM | POA: Diagnosis not present

## 2019-03-09 HISTORY — PX: CYSTOSCOPY: SHX5120

## 2019-03-09 HISTORY — PX: APPLICATION OF WOUND VAC: SHX5189

## 2019-03-09 HISTORY — DX: Anemia, unspecified: D64.9

## 2019-03-09 HISTORY — PX: COLOSTOMY: SHX63

## 2019-03-09 HISTORY — PX: TOTAL LAPAROSCOPIC HYSTERECTOMY WITH SALPINGECTOMY: SHX6742

## 2019-03-09 HISTORY — PX: PROCTOSCOPY: SHX2266

## 2019-03-09 LAB — BASIC METABOLIC PANEL
Anion gap: 9 (ref 5–15)
BUN: 5 mg/dL — ABNORMAL LOW (ref 6–20)
CO2: 23 mmol/L (ref 22–32)
Calcium: 8.2 mg/dL — ABNORMAL LOW (ref 8.9–10.3)
Chloride: 107 mmol/L (ref 98–111)
Creatinine, Ser: 0.82 mg/dL (ref 0.44–1.00)
GFR calc Af Amer: >60 mL/min (ref >60)
GFR calc non Af Amer: >60 mL/min (ref >60)
Glucose, Bld: 198 mg/dL — ABNORMAL HIGH (ref 70–99)
Potassium: 3.5 mmol/L (ref 3.5–5.1)
Sodium: 139 mmol/L (ref 135–145)

## 2019-03-09 LAB — CBC
HCT: 30.7 % — ABNORMAL LOW (ref 36.0–46.0)
Hemoglobin: 10.6 g/dL — ABNORMAL LOW (ref 12.0–15.0)
MCH: 29.2 pg (ref 26.0–34.0)
MCHC: 34.5 g/dL (ref 30.0–36.0)
MCV: 84.6 fL (ref 80.0–100.0)
Platelets: 302 10*3/uL (ref 150–400)
RBC: 3.63 MIL/uL — ABNORMAL LOW (ref 3.87–5.11)
RDW: 13.1 % (ref 11.5–15.5)
WBC: 18.2 10*3/uL — ABNORMAL HIGH (ref 4.0–10.5)
nRBC: 0 % (ref 0.0–0.2)

## 2019-03-09 LAB — PREPARE RBC (CROSSMATCH)

## 2019-03-09 LAB — POCT HEMOGLOBIN-HEMACUE: Hemoglobin: 10.3 g/dL — ABNORMAL LOW (ref 12.0–15.0)

## 2019-03-09 SURGERY — HYSTERECTOMY, TOTAL, LAPAROSCOPIC, WITH SALPINGECTOMY
Anesthesia: General | Site: Urethra

## 2019-03-09 MED ORDER — MIDAZOLAM HCL 2 MG/2ML IJ SOLN
INTRAMUSCULAR | Status: AC
  Filled 2019-03-09: qty 2

## 2019-03-09 MED ORDER — KETOROLAC TROMETHAMINE 30 MG/ML IJ SOLN
30.0000 mg | Freq: Four times a day (QID) | INTRAMUSCULAR | Status: DC | PRN
  Administered 2019-03-09 – 2019-03-13 (×7): 30 mg via INTRAVENOUS
  Filled 2019-03-09 (×6): qty 1

## 2019-03-09 MED ORDER — PROPOFOL 10 MG/ML IV BOLUS
INTRAVENOUS | Status: AC
  Filled 2019-03-09: qty 20

## 2019-03-09 MED ORDER — PIPERACILLIN-TAZOBACTAM 3.375 G IVPB
3.3750 g | Freq: Three times a day (TID) | INTRAVENOUS | Status: AC
  Administered 2019-03-09 – 2019-03-14 (×15): 3.375 g via INTRAVENOUS
  Filled 2019-03-09 (×15): qty 50

## 2019-03-09 MED ORDER — HYDROMORPHONE HCL 1 MG/ML IJ SOLN
INTRAMUSCULAR | Status: AC
  Filled 2019-03-09: qty 0.5

## 2019-03-09 MED ORDER — FENTANYL CITRATE (PF) 100 MCG/2ML IJ SOLN
50.0000 ug | INTRAMUSCULAR | Status: AC | PRN
  Administered 2019-03-09 (×4): 50 ug via INTRAVENOUS
  Administered 2019-03-09: 100 ug via INTRAVENOUS
  Administered 2019-03-09: 50 ug via INTRAVENOUS

## 2019-03-09 MED ORDER — ONDANSETRON HCL 4 MG/2ML IJ SOLN: 4.0000 mg | Freq: Four times a day (QID) | INTRAMUSCULAR | Status: DC | PRN

## 2019-03-09 MED ORDER — ONDANSETRON HCL 4 MG PO TABS: 4.0000 mg | ORAL_TABLET | Freq: Four times a day (QID) | ORAL | Status: DC | PRN

## 2019-03-09 MED ORDER — KETOROLAC TROMETHAMINE 30 MG/ML IJ SOLN
30.0000 mg | Freq: Four times a day (QID) | INTRAMUSCULAR | Status: DC | PRN
  Filled 2019-03-09: qty 1

## 2019-03-09 MED ORDER — ONDANSETRON HCL 4 MG/2ML IJ SOLN
INTRAMUSCULAR | Status: AC
  Filled 2019-03-09: qty 2

## 2019-03-09 MED ORDER — ACETAMINOPHEN 500 MG PO TABS
1000.0000 mg | ORAL_TABLET | Freq: Once | ORAL | Status: AC
  Administered 2019-03-09: 1000 mg via ORAL

## 2019-03-09 MED ORDER — FENTANYL CITRATE (PF) 100 MCG/2ML IJ SOLN
INTRAMUSCULAR | Status: AC
  Filled 2019-03-09: qty 2

## 2019-03-09 MED ORDER — DEXAMETHASONE SODIUM PHOSPHATE 10 MG/ML IJ SOLN
INTRAMUSCULAR | Status: AC
  Filled 2019-03-09: qty 1

## 2019-03-09 MED ORDER — SUGAMMADEX SODIUM 500 MG/5ML IV SOLN
INTRAVENOUS | Status: DC | PRN
  Administered 2019-03-09: 200 mg via INTRAVENOUS

## 2019-03-09 MED ORDER — ROCURONIUM BROMIDE 10 MG/ML (PF) SYRINGE
PREFILLED_SYRINGE | INTRAVENOUS | Status: DC | PRN
  Administered 2019-03-09 (×2): 20 mg via INTRAVENOUS
  Administered 2019-03-09: 50 mg via INTRAVENOUS
  Administered 2019-03-09: 20 mg via INTRAVENOUS

## 2019-03-09 MED ORDER — LIDOCAINE 2% (20 MG/ML) 5 ML SYRINGE
INTRAMUSCULAR | Status: DC | PRN
  Administered 2019-03-09: 100 mg via INTRAVENOUS

## 2019-03-09 MED ORDER — ALBUMIN HUMAN 5 % IV SOLN
INTRAVENOUS | Status: AC
  Filled 2019-03-09: qty 250

## 2019-03-09 MED ORDER — LIDOCAINE-EPINEPHRINE 1 %-1:100000 IJ SOLN
INTRAMUSCULAR | Status: AC
  Filled 2019-03-09: qty 1

## 2019-03-09 MED ORDER — DOCUSATE SODIUM 100 MG PO CAPS
100.0000 mg | ORAL_CAPSULE | Freq: Two times a day (BID) | ORAL | Status: DC
  Administered 2019-03-10 – 2019-03-15 (×11): 100 mg via ORAL
  Filled 2019-03-09 (×11): qty 1

## 2019-03-09 MED ORDER — PROPOFOL 10 MG/ML IV BOLUS
INTRAVENOUS | Status: DC | PRN
  Administered 2019-03-09: 170 mg via INTRAVENOUS

## 2019-03-09 MED ORDER — HYDROMORPHONE HCL 1 MG/ML IJ SOLN
0.5000 mg | INTRAMUSCULAR | Status: DC | PRN
  Administered 2019-03-09 – 2019-03-10 (×7): 0.5 mg via INTRAVENOUS
  Filled 2019-03-09 (×3): qty 1

## 2019-03-09 MED ORDER — KETAMINE HCL 100 MG/ML IJ SOLN
INTRAMUSCULAR | Status: DC | PRN
  Administered 2019-03-09: 30 mg via INTRAVENOUS

## 2019-03-09 MED ORDER — LACTATED RINGERS IV SOLN
INTRAVENOUS | Status: DC
  Administered 2019-03-09 (×3): via INTRAVENOUS

## 2019-03-09 MED ORDER — LIDOCAINE 2% (20 MG/ML) 5 ML SYRINGE
INTRAMUSCULAR | Status: AC
  Filled 2019-03-09: qty 5

## 2019-03-09 MED ORDER — LACTATED RINGERS IV SOLN
INTRAVENOUS | Status: DC
  Administered 2019-03-09 (×3): via INTRAVENOUS

## 2019-03-09 MED ORDER — ENOXAPARIN SODIUM 40 MG/0.4ML ~~LOC~~ SOLN
40.0000 mg | SUBCUTANEOUS | Status: DC
  Administered 2019-03-10 – 2019-03-14 (×5): 40 mg via SUBCUTANEOUS
  Filled 2019-03-09 (×5): qty 0.4

## 2019-03-09 MED ORDER — PROPOFOL 500 MG/50ML IV EMUL
INTRAVENOUS | Status: AC
  Filled 2019-03-09: qty 100

## 2019-03-09 MED ORDER — ACETAMINOPHEN 500 MG PO TABS
ORAL_TABLET | ORAL | Status: AC
  Filled 2019-03-09: qty 2

## 2019-03-09 MED ORDER — DEXAMETHASONE SODIUM PHOSPHATE 10 MG/ML IJ SOLN
INTRAMUSCULAR | Status: DC | PRN
  Administered 2019-03-09: 5 mg via INTRAVENOUS

## 2019-03-09 MED ORDER — CEFAZOLIN SODIUM-DEXTROSE 2-4 GM/100ML-% IV SOLN
INTRAVENOUS | Status: AC
  Filled 2019-03-09: qty 100

## 2019-03-09 MED ORDER — SCOPOLAMINE 1 MG/3DAYS TD PT72: 1.0000 | MEDICATED_PATCH | Freq: Once | TRANSDERMAL | Status: DC

## 2019-03-09 MED ORDER — SODIUM CHLORIDE 0.9 % IR SOLN
Status: DC | PRN
  Administered 2019-03-09: 2000 mL

## 2019-03-09 MED ORDER — MIDAZOLAM HCL 2 MG/2ML IJ SOLN
1.0000 mg | INTRAMUSCULAR | Status: DC | PRN
  Administered 2019-03-09: 15:00:00 1 mg via INTRAVENOUS
  Administered 2019-03-09: 13:00:00 2 mg via INTRAVENOUS

## 2019-03-09 MED ORDER — KETAMINE HCL 100 MG/ML IJ SOLN
INTRAMUSCULAR | Status: AC
  Filled 2019-03-09: qty 1

## 2019-03-09 MED ORDER — 0.9 % SODIUM CHLORIDE (POUR BTL) OPTIME
TOPICAL | Status: DC | PRN
  Administered 2019-03-09: 1000 mL

## 2019-03-09 MED ORDER — ROCURONIUM BROMIDE 10 MG/ML (PF) SYRINGE
PREFILLED_SYRINGE | INTRAVENOUS | Status: AC
  Filled 2019-03-09: qty 10

## 2019-03-09 MED ORDER — PANTOPRAZOLE SODIUM 40 MG IV SOLR
40.0000 mg | Freq: Every day | INTRAVENOUS | Status: DC
  Administered 2019-03-09 – 2019-03-13 (×5): 40 mg via INTRAVENOUS
  Filled 2019-03-09 (×6): qty 40

## 2019-03-09 MED ORDER — ONDANSETRON HCL 4 MG/2ML IJ SOLN
INTRAMUSCULAR | Status: DC | PRN
  Administered 2019-03-09: 4 mg via INTRAVENOUS

## 2019-03-09 MED ORDER — ALBUMIN HUMAN 5 % IV SOLN
INTRAVENOUS | Status: DC | PRN
  Administered 2019-03-09 (×2): via INTRAVENOUS

## 2019-03-09 MED ORDER — LACTATED RINGERS IV SOLN
INTRAVENOUS | Status: DC
  Administered 2019-03-09: 22:00:00 via INTRAVENOUS

## 2019-03-09 MED ORDER — SODIUM CHLORIDE 0.9 % IV SOLN
2.0000 g | Freq: Once | INTRAVENOUS | Status: AC
  Administered 2019-03-09: 2 g via INTRAVENOUS
  Filled 2019-03-09 (×2): qty 2

## 2019-03-09 MED ORDER — CEFAZOLIN SODIUM-DEXTROSE 2-4 GM/100ML-% IV SOLN
2.0000 g | INTRAVENOUS | Status: AC
  Administered 2019-03-09: 2 g via INTRAVENOUS

## 2019-03-09 SURGICAL SUPPLY — 83 items
APPLICATOR ARISTA FLEXITIP XL (MISCELLANEOUS) IMPLANT
BENZOIN TINCTURE PRP APPL 2/3 (GAUZE/BANDAGES/DRESSINGS) ×16 IMPLANT
BLADE 15 SAFETY STRL DISP (BLADE) ×8 IMPLANT
CABLE HIGH FREQUENCY MONO STRZ (ELECTRODE) IMPLANT
CANISTER SUCT 3000ML PPV (MISCELLANEOUS) IMPLANT
CONT PATH 16OZ SNAP LID 3702 (MISCELLANEOUS) IMPLANT
COVER BACK TABLE 60X90IN (DRAPES) IMPLANT
COVER MAYO STAND STRL (DRAPES) ×8 IMPLANT
DECANTER SPIKE VIAL GLASS SM (MISCELLANEOUS) IMPLANT
DEFOGGER SCOPE WARMER CLEARIFY (MISCELLANEOUS) IMPLANT
DERMABOND ADVANCED (GAUZE/BANDAGES/DRESSINGS)
DERMABOND ADVANCED .7 DNX12 (GAUZE/BANDAGES/DRESSINGS) IMPLANT
DRSG COVADERM PLUS 2X2 (GAUZE/BANDAGES/DRESSINGS) IMPLANT
DRSG OPSITE POSTOP 3X4 (GAUZE/BANDAGES/DRESSINGS) IMPLANT
DRSG TEGADERM 2-3/8X2-3/4 SM (GAUZE/BANDAGES/DRESSINGS) ×8 IMPLANT
DRSG TEGADERM 4X10 (GAUZE/BANDAGES/DRESSINGS) ×8 IMPLANT
DRSG TEGADERM 4X4.75 (GAUZE/BANDAGES/DRESSINGS) ×8 IMPLANT
DRSG VAC ATS MED SENSATRAC (GAUZE/BANDAGES/DRESSINGS) ×8 IMPLANT
DURAPREP 26ML APPLICATOR (WOUND CARE) ×8 IMPLANT
ELECT REM PT RETURN 9FT ADLT (ELECTROSURGICAL) ×8
ELECTRODE REM PT RTRN 9FT ADLT (ELECTROSURGICAL) ×6 IMPLANT
GLOVE BIOGEL PI IND STRL 6.5 (GLOVE) ×12 IMPLANT
GLOVE BIOGEL PI IND STRL 7.0 (GLOVE) ×12 IMPLANT
GLOVE BIOGEL PI IND STRL 7.5 (GLOVE) ×12 IMPLANT
GLOVE BIOGEL PI INDICATOR 6.5 (GLOVE) ×4
GLOVE BIOGEL PI INDICATOR 7.0 (GLOVE) ×4
GLOVE BIOGEL PI INDICATOR 7.5 (GLOVE) ×4
GLOVE SURG SS PI 7.0 STRL IVOR (GLOVE) ×8 IMPLANT
GOWN STRL REUS W/ TWL LRG LVL3 (GOWN DISPOSABLE) ×12 IMPLANT
GOWN STRL REUS W/ TWL XL LVL3 (GOWN DISPOSABLE) ×6 IMPLANT
GOWN STRL REUS W/TWL LRG LVL3 (GOWN DISPOSABLE) ×4
GOWN STRL REUS W/TWL XL LVL3 (GOWN DISPOSABLE) ×2
HEMOSTAT ARISTA ABSORB 3G PWDR (HEMOSTASIS) IMPLANT
HIBICLENS CHG 4% 4OZ BTL (MISCELLANEOUS) ×8 IMPLANT
KIT OSTOMY DRAINABLE 2.75 STR (WOUND CARE) ×24 IMPLANT
LEGGING LITHOTOMY PAIR STRL (DRAPES) IMPLANT
LIGASURE VESSEL 5MM BLUNT TIP (ELECTROSURGICAL) ×8 IMPLANT
MANIPULATOR VCARE MED CRV RETR (MISCELLANEOUS) ×8 IMPLANT
NS IRRIG 1000ML POUR BTL (IV SOLUTION) ×8 IMPLANT
PACK LAPAROSCOPY BASIN (CUSTOM PROCEDURE TRAY) IMPLANT
PACK LAVH (CUSTOM PROCEDURE TRAY) ×8 IMPLANT
PACK LITHOTOMY IV (CUSTOM PROCEDURE TRAY) ×8 IMPLANT
PACK TRENDGUARD 450 HYBRID PRO (MISCELLANEOUS) ×6 IMPLANT
POUCH LAPAROSCOPIC INSTRUMENT (MISCELLANEOUS) ×8 IMPLANT
SCISSORS LAP 5X45 EPIX DISP (ENDOMECHANICALS) ×8 IMPLANT
SET CYSTO W/LG BORE CLAMP LF (SET/KITS/TRAYS/PACK) IMPLANT
SET IRRIG TUBING LAPAROSCOPIC (IRRIGATION / IRRIGATOR) ×8 IMPLANT
SET IRRIG Y TYPE TUR BLADDER L (SET/KITS/TRAYS/PACK) ×8 IMPLANT
SET TUBE SMOKE EVAC HIGH FLOW (TUBING) ×8 IMPLANT
SLEEVE ADV FIXATION 5X100MM (TROCAR) IMPLANT
SLEEVE ENDOPATH XCEL 5M (ENDOMECHANICALS) ×16 IMPLANT
SLEEVE XCEL OPT CAN 5 100 (ENDOMECHANICALS) IMPLANT
SPONGE LAP 18X18 RF (DISPOSABLE) ×40 IMPLANT
STAPLER PROXIMATE 75MM BLUE (STAPLE) ×8 IMPLANT
SUT ETHIBOND NAB CT1 #1 30IN (SUTURE) IMPLANT
SUT MNCRL AB 4-0 PS2 18 (SUTURE) ×8 IMPLANT
SUT MON AB 4-0 PS1 27 (SUTURE) IMPLANT
SUT PDS II 0 TP-1 LOOPED 60 (SUTURE) ×16 IMPLANT
SUT PROLENE 2 0 CT2 30 (SUTURE) ×8 IMPLANT
SUT SILK 2 0 SH (SUTURE) ×48 IMPLANT
SUT VIC AB 0 CT1 27 (SUTURE) ×4
SUT VIC AB 0 CT1 27XBRD ANBCTR (SUTURE) ×12 IMPLANT
SUT VIC AB 0 CT1 27XCR 8 STRN (SUTURE) IMPLANT
SUT VIC AB 0 CT1 36 (SUTURE) IMPLANT
SUT VIC AB 1 CT1 27 (SUTURE)
SUT VIC AB 1 CT1 27XBRD ANBCTR (SUTURE) IMPLANT
SUT VIC AB 1 CT1 36 (SUTURE) IMPLANT
SUT VIC AB 2-0 CT1 (SUTURE) IMPLANT
SUT VIC AB 2-0 CT1 27 (SUTURE) ×10
SUT VIC AB 2-0 CT1 TAPERPNT 27 (SUTURE) ×30 IMPLANT
SUT VICRYL 0 UR6 27IN ABS (SUTURE) IMPLANT
SUT VICRYL 3-0 CR8 SH (SUTURE) ×16 IMPLANT
SYR 50ML LL SCALE MARK (SYRINGE) ×8 IMPLANT
SYR BULB IRRIGATION 50ML (SYRINGE) ×8 IMPLANT
TOWEL OR 17X24 6PK STRL BLUE (TOWEL DISPOSABLE) ×16 IMPLANT
TRAY FOLEY W/BAG SLVR 14FR (SET/KITS/TRAYS/PACK) ×8 IMPLANT
TRENDGUARD 450 HYBRID PRO PACK (MISCELLANEOUS) ×8
TROCAR ADV FIXATION 5X100MM (TROCAR) IMPLANT
TROCAR BALLN 12MMX100 BLUNT (TROCAR) IMPLANT
TROCAR XCEL NON-BLD 11X100MML (ENDOMECHANICALS) IMPLANT
TROCAR XCEL NON-BLD 5MMX100MML (ENDOMECHANICALS) ×8 IMPLANT
Tracc ×8 IMPLANT
WARMER LAPAROSCOPE (MISCELLANEOUS) ×8 IMPLANT

## 2019-03-09 NOTE — Anesthesia Procedure Notes (Signed)
Procedure Name: Intubation Date/Time: 03/09/2019 1:02 PM Performed by: Myna Bright, CRNA Pre-anesthesia Checklist: Patient identified, Emergency Drugs available, Suction available and Patient being monitored Patient Re-evaluated:Patient Re-evaluated prior to induction Oxygen Delivery Method: Circle system utilized Preoxygenation: Pre-oxygenation with 100% oxygen Induction Type: IV induction Ventilation: Mask ventilation without difficulty Laryngoscope Size: Mac and 3 Grade View: Grade I Tube type: Oral Tube size: 7.0 mm Number of attempts: 1 Airway Equipment and Method: Stylet Placement Confirmation: ETT inserted through vocal cords under direct vision,  positive ETCO2 and breath sounds checked- equal and bilateral Secured at: 21 cm Tube secured with: Tape Dental Injury: Teeth and Oropharynx as per pre-operative assessment

## 2019-03-09 NOTE — Progress Notes (Signed)
Patient settled in room at [facility]. She is more alert now, I reviewed the case and the injury/complications, expected outcomes and plan with her. She is in pain but verbalizes understanding of the above. Will review in further detail in am.    K. Arvilla Meres, M.D. Attending Center for [employer] Fish farm manager)

## 2019-03-09 NOTE — H&P (Signed)
OB/GYN History and Physical  Mylene TINSLEIGH SLOVACEK is a 48 y.o. [phone] presenting for laparoscopic hysterectomy for abnormal uterine bleeding. Bleeding much lighter on provera, now with irregular periods and occasional spotting. Neg pap, neg EMB.      Past Medical History:  Diagnosis Date  . Anemia   . Fibroid   . Medical history non-contributory     Past Surgical History:  Procedure Laterality Date  . TUBAL LIGATION  1996    OB History  Gravida Para Term Preterm AB Living  3 3 3  0 0 3  SAB TAB Ectopic Multiple Live Births  0 0 0 0 3    # Outcome Date GA Lbr Len/2nd Weight Sex Delivery Anes PTL Lv  3 Term      Vag-Spont   LIV  2 Term      Vag-Spont   LIV  1 Term      Vag-Spont   LIV    Social History   Socioeconomic History  . Marital status: Single    Spouse name: Not on file  . Number of children: Not on file  . Years of education: Not on file  . Highest education level: Not on file  Occupational History  . Not on file  Social Needs  . Financial resource strain: Not on file  . Food insecurity    Worry: Not on file    Inability: Not on file  . Transportation needs    Medical: Not on file    Non-medical: Not on file  Tobacco Use  . Smoking status: Never Smoker  . Smokeless tobacco: Never Used  Substance and Sexual Activity  . Alcohol use: Yes    Comment: occasionally  . Drug use: Yes    Types: Marijuana    Comment: occasionally  . Sexual activity: Yes    Birth control/protection: Surgical  Lifestyle  . Physical activity    Days per week: Not on file    Minutes per session: Not on file  . Stress: Not on file  Relationships  . Social Herbalist on phone: Not on file    Gets together: Not on file    Attends religious service: Not on file    Active member of club or organization: Not on file    Attends meetings of clubs or organizations: Not on file    Relationship status: Not on file  Other Topics Concern  . Not on file  Social History  Narrative  . Not on file    Family History  Problem Relation Age of Onset  . Diabetes Father     Medications Prior to Admission  Medication Sig Dispense Refill Last Dose  . ferrous sulfate (IRON SUPPLEMENT) 325 (65 FE) MG tablet Take 1 tablet (325 mg total) by mouth 2 (two) times daily with a meal. 60 tablet 3 Past Week at Unknown time  . medroxyPROGESTERone (PROVERA) 10 MG tablet TAKE 2 TABLETS BY MOUTH EVERY DAY 180 tablet 1 Past Week at Unknown time    No Known Allergies  Review of Systems: Negative except for what is mentioned in HPI.     Physical Exam: BP (!) 148/88   Pulse 72   Temp 99 F (37.2 C) (Oral)   Resp 18   Ht 5\' 4"  (1.626 m)   Wt 73.6 kg   LMP 02/02/2019 (Approximate)   SpO2 98%   BMI 27.85 kg/m  CONSTITUTIONAL: Well-developed, well-nourished female in no acute distress.  HENT:  Normocephalic, atraumatic,  External right and left ear normal. Oropharynx is clear and moist EYES: Conjunctivae and EOM are normal. Pupils are equal, round, and reactive to light. No scleral icterus.  NECK: Normal range of motion, supple, no masses SKIN: Skin is warm and dry. No rash noted. Not diaphoretic. No erythema. No pallor. [place]: Alert and oriented to person, place, and time. Normal reflexes, muscle tone coordination. No cranial nerve deficit noted. PSYCHIATRIC: Normal mood and affect. Normal behavior. Normal judgment and thought content. CARDIOVASCULAR: Normal heart rate noted, regular rhythm RESPIRATORY: Effort normal, no problems with respiration noted ABDOMEN: Soft, nontender, nondistended, gravid.  PELVIC: Deferred MUSCULOSKELETAL: Normal range of motion. No edema and no tenderness. 2+ distal pulses.   Pertinent Labs/Studies:   Results for orders placed or performed during the hospital encounter of 03/09/19 (from the past 72 hour(s))  Type and screen [facility]     Status: None   Collection Time: 03/08/19 10:55 AM  Result Value Ref Range    ABO/RH(D) A POS    Antibody Screen NEG    Sample Expiration      03/11/2019,2359 Performed at [facility], [place] [address] [place], [place] [address]   ABO/Rh     Status: None   Collection Time: 03/08/19 10:55 AM  Result Value Ref Range   ABO/RH(D)      A POS Performed at [facility] [address], [place], [place] [address]   CBC     Status: None   Collection Time: 03/08/19 12:33 PM  Result Value Ref Range   WBC 6.0 4.0 - 10.5 K/uL   RBC 5.07 3.87 - 5.11 MIL/uL   Hemoglobin 14.4 12.0 - 15.0 g/dL   HCT 42.5 36.0 - 46.0 %   MCV 83.8 80.0 - 100.0 fL   MCH 28.4 26.0 - 34.0 pg   MCHC 33.9 30.0 - 36.0 g/dL   RDW 13.0 11.5 - 15.5 %   Platelets 211 150 - 400 K/uL   nRBC 0.0 0.0 - 0.2 %    Comment: Performed at [facility], [place] [address], [place], [place] [address]  Basic metabolic panel     Status: Abnormal   Collection Time: 03/08/19 12:33 PM  Result Value Ref Range   Sodium 142 135 - 145 mmol/L   Potassium 3.9 3.5 - 5.1 mmol/L   Chloride 108 98 - 111 mmol/L   CO2 26 22 - 32 mmol/L   Glucose, Bld 104 (H) 70 - 99 mg/dL   BUN 8 6 - 20 mg/dL   Creatinine, Ser 0.91 0.44 - 1.00 mg/dL   Calcium 9.2 8.9 - 10.3 mg/dL   GFR calc non Af Amer >60 >60 mL/min   GFR calc Af Amer >60 >60 mL/min   Anion gap 8 5 - 15    Comment: Performed at [facility] [address], [place], [place] [address]  hCG, quantitative, pregnancy     Status: None   Collection Time: 03/08/19 12:34 PM  Result Value Ref Range   hCG, Beta Chain, Quant, S <1 <5 mIU/mL    Comment:          GEST. AGE      CONC.  (mIU/mL)   <=1 WEEK        5 - 50     2 WEEKS       50 - 500     3 WEEKS       100 - 10,000  4 WEEKS     1,000 - 30,000     5 WEEKS     3,500 - 115,000   6-8 WEEKS     12,000 - 270,000    12 WEEKS     15,000 - 220,000        FEMALE AND NON-PREGNANT FEMALE:     LESS THAN 5 mIU/mL Performed at [facility], [place] [address], [place], [place] [address]         Assessment and Plan :AMBROSIA WISNEWSKI is a 48 y.o. [phone] admitted for total laparoscopic hysterectomy with bilateral salpingectomy. The risks of laparoscopic hysterectomy were discussed with the patient; including but not limited to: infection which may require antibiotics; bleeding which may require transfusion or re-operation; injury to bowel, bladder, ureters or other surrounding organs; need for additional procedures, conversion to laparotomy, incisional problems, thromboembolic phenomenon and other postoperative/anesthesia complications. Reviewed that with a hysterectomy, she will not be able to have children in the future. Reviewed recommendation to remove fallopian tubes to reduce risk of ovarian cancer, but as she is 7, would not recommend removal of ovaries at this time. Reviewed that ovaries may be removed if they are abnormal appearing, anatomy requires it or if there is damage to blood supply. Reviewed that removal of ovaries would put her into surgical menopause and we will attempt to avoid this if possible. Reviewed expected post surgery course and hospital stay.  Patient verbalized understanding of the above and consents to blood transfusion in the event of a life-threatening hemorrhage. Answered all questions.   Patient is NPO Anesthesia aware Preoperative prophylactic antibiotics ordered SCDs  Admission labs To OR when ready    K. Arvilla Meres, M.D. Attending [place], [facility] for [employer], [place]

## 2019-03-09 NOTE — Progress Notes (Signed)
Wound vac with leak in PACU despite application of several additional tegaderms to seal leak. Removed wound vac per Dr. Donne Hazel and replaced with wet to dry dressing and AB pad over it with tape. Ostomy apparatus in place. Patient transported to [facility] by [place].    Feliz Beam, M.D. Attending Center for [employer] Fish farm manager)

## 2019-03-09 NOTE — Op Note (Signed)
Tasha Payne PROCEDURE DATE: 03/09/2019  PREOPERATIVE DIAGNOSES: fibroid uterus, abnormal uterine bleeding  POSTOPERATIVE DIAGNOSES: same  PROCEDURE: HYSTERECTOMY TOTAL LAPAROSCOPIC WITH BILATERAL SALPINGECTOMY (Bilateral) END DESCENDING COLOSTOMY (N/A) APPLICATION OF WOUND VAC PROCTOSCOPY (N/A)  SURGEON:  Feliz Beam, MD  SURGEON:  Surgeon(s) and Role: Panel 1:    * Sloan Leiter, MD - Primary    * Aletha Halim, MD - Assisting Panel 2:    Rolm Bookbinder, MD - Primary    * Cornett, Marcello Moores, MD - Assisting  An experienced assistant was required given the standard of surgical care given the complexity of the case.  This assistant was needed for exposure, dissection, suctioning, retraction, instrument exchange, and for overall help during the procedure.   ANESTHESIOLOGY TEAM: Anesthesiologist: Barnet Glasgow, MD CRNA: Blocker, Ernesta Amble, CRNA; Myna Bright, CRNA; Willa Frater, CRNA  INDICATIONS: Tasha Payne is a 48 y.o. [phone] here for laparoscopic hysterectomy secondary to the indications listed under preoperative diagnoses; please see preoperative note for further details.  The risks of laparoscopic hysterectomy were discussed with the patient including but were not limited to: bleeding which may require transfusion or reoperation; infection which may require antibiotics; injury to bowel, bladder, ureters or other surrounding organs; need for additional procedures, incisional problems, thromboembolic phenomenon and other postoperative/anesthesia complications. She verbalizes understanding that removal of her uterus means she will not be able to get pregnant/carry a pregnancy. She consents to blood transfusion in the event of an emergency. The patient verbalized understanding of the plan, giving informed written consent for the procedure.    FINDINGS:  normal appearing abdomen, large, multi-fibroid uterus with several pedunculated fibroids, bilateral fallopian  tubes with evidence of prior tubal ligation, normal appearing ovaries bilaterally with small paraovarian cyst on right, normal appearing bladder mucosa with bilateral ureteral jets noted  UPT: negative ANESTHESIA: General ANTIBIOTICS: Ancef, Cefoxitin INTRAVENOUS FLUIDS: 3000 ml   ESTIMATED BLOOD LOSS: 950 ml URINE OUTPUT:  1400 ml SPECIMENS: Uterus, bilateral tubes sent to pathology COMPLICATIONS: bowel injury requiring repair by general surgery  PROCEDURE: After informed consent was obtained, the patient was taken to the operating room where general anesthesia was obtained without difficulty and an ET tube was placed. The patient was positioned in the dorsal lithotomy position in stirrups and her arms were carefully tucked at her sides and padded appropriately.  She was prepped and draped in normal sterile fashion.  Time-out was performed and a Foley catheter was placed into the bladder. A standard VCare uterine manipulator was then placed in the uterus without incident. Gloves were then changed and attention turned to her abdomen. Attention was then turned to the patient's abdomen where a 5-mm skin incision was made in the umbilical fold.  The veress needle was introduced into the abdomen without difficulty with an opening pressure of 4 mm Hg. The CO2 gas was then insufflated to a pneumoperitoneum of 15 mm Hg. The veress needle was removed and a 5 mm trocar was advanced with visualization from the laparoscope. The introducer was removed and the laparoscope introduced and intra-abdominal placement confirmed. Atraumatic injury noted. Survey of the abdomen and pelvis were as noted above. A 5 mm port was placed into the left lower abdomen 2 cm proximal and median from the left ASIS after incision with a scalpel.  The port was advanced under direct visualization. A 5 mm port was placed in the right lower abdomen 2 cm proximal and medical to the right ASIS after  incision with the scalpel. The port was  advanced under direct visualization. An 11 mm port was placed in the suprapubic area after incision with the scalpel. The port was advanced under direct visualization. The patient was placed into trendelenburg.   The bilateral cornua were then cauterized with the Ligasure device, and the round ligaments were divided on each side with the Ligasure and blunt dissectors and the retroperitoneal space was opened bilaterally.  A bladder flap was created and the bladder was dissected down off the lower uterine segment and cervix using endoshears and electrocautery.  The broad ligaments were then opened, and the uterine arteries were skeletonized bilaterally, sealed and divided with the Ligasure device.  A colpotomy was performed circumferentially along the ring with electrocautery and the cervix was incised from the vagina. The specimen was morcellated and after several small uterine pieces were removed, stool noted within the vagina. At this point, general surgery was called for bowel injury and injury confirmed with a digital exam of the rectum showing injury through to posterior vaginal wall. Dr. Donne Hazel of General Surgery presented and after examination, recommended proceeding with open procedure to divert and drain patient. Please see his operative note for details of this procedure.   Dr. Donne Hazel proceeded to make a vertical midline incision, carrying through to the fascia. The remained of the uterus was removed through this incision. The vaginal cuff was closed with 0 vicryl in two layers, taking care to incorporate the uterosacral ligaments. The remnant of the right fallopian tube was grasped with a babcock and clamped using a . The fallopian tube was excised using Mayo scissors. A stitch was placed around the pedicle, incorporating the entire pedicle with 2-0 Silk and hemostasis noted at the pedicle. The same procedure was performed on the left side. The tubes were added to the uterus specimen and  sent to pathology.  Dr. Donne Hazel then proceeded to perform an end colostomy, see his note for additional details.   Cystoscopy performed with no injury to bladder and bilateral ureteral jets noted. The posterior vaginal wall injury was inspected and noted to be bleeding at the edge of the mucosa so several figure of 8 sutures were placed with 2-0 vicryl to control bleeding, hemostasis noted. The rectum and vagina were irrigated of stool and suctioned and were clean by the end of the procedure. Decision made to leave posterior vaginal wall without repair in order to let tissue heal. Vagina pack with moist vaginal packing to ensure hemostasis overnight. Foley catheter replaced under sterile conditions.   Terrial Rhodes drain placed through right lower port site. Left lower port site and suprapubic port site closed with surgical staple. Dr. Donne Hazel placed a wound vacuum in the midline incision and placed the ostomy apparatus. X ray performed with no instruments/laps noted intra-abdominally. Laps, sponges, instruments, sharps counts correct x2. The patient was extubated and taken to PACU in fair condition.    Feliz Beam, M.D. Attending Center for [employer] (Faculty Practice)  03/09/2019 8:34 PM

## 2019-03-09 NOTE — Op Note (Addendum)
Preoperative diagnosis: Rectal injury during laparoscopic assisted vaginal hysterectomy Postoperative diagnosis same as above Procedure: 1.  Rigid proctoscopy 2.  End descending colostomy 3.  Suture repair of rectal laceration 4. Placement of negative pressure dressing Surgeon: Dr. Serita Grammes Asst: Dr Christie Beckers Anesthesia: General Estimated blood loss: See gynecology operative note Complications: None Specimens: None Sponge needle count was correct at completion Disposition to recovery stable  Indications: This is a 48 year old female who was undergoing a laparoscopic-assisted vaginal hysterectomy.  I was consulted intraoperatively had not met the patient before operation.  She had an apparent rectal injury during the procedure.  Procedure: I entered into the operating room during the procedure.  I discussed the patient and the situation with Dr. Rosana Hoes.  I then scrubbed in.  I first did a examination of her rectum and vagina.  She had a large segment defect that appeared on rigid proctoscopy on exam to start at about 5 or 6 cm and extended to 13 cm then this included basically the anterior third of the rectal wall.  There was a large posterior vaginal wall laceration as well.  I did not think that there was a way to repair this.  I elected then to divert and drain her.  We did evacuate as much stool as we could from below.  I then went above and made a low midline incision.  I entered into the peritoneal cavity without injury.  We evacuated the blood. I asked Dr Brantley Stage to come to OR to do rigid proctoscopy from below while I was intraabdominal to get a more accurate sense of the injury. I needed his assistance to do this portion of the case.  The vaginal cuff was closed by Dr. Rosana Hoes.  I then mobilized the left colon.  I picked a point near the umbilicus that will hopefully be good for a stoma.  I then used a Coker clamp and remove the skin as well as the cord down to the fascia.  I made a  cruciate incision in the fascia and entered in the peritoneum.  I then released the left colon by taking down the white line of Toldt.  I then brought up the sigmoid colon.  This reached easily.  I then used a GIA stapler to divide this.  I divided a small portion of the mesentery.  I brought this and descending colostomy up through the hole.  It went easily.  I then placed sutures of 2-0 Prolene on the distal stump.  I then tacked 1 of these to the left lateral abdominal wall.  The intraperitoneal portion of the rectal injury I then closed with multiple 2-0 silk sutures.  I then placed a 19 [place] Blake drain.  I placed the omentum overlying all of this.  I then closed the abdominal incision with #1 looped PDS.  I then matured the stoma with 3-0 Vicryl suture.  A VAC dressing and a stoma appliance were then placed.

## 2019-03-09 NOTE — Brief Op Note (Signed)
03/09/2019  8:05 PM  PATIENT:  Tasha Payne  48 y.o. female  PRE-OPERATIVE DIAGNOSIS:  Abnormal Uterine Bleeding  POST-OPERATIVE DIAGNOSIS:  Abnormal Uterine Bleeding  PROCEDURE:  Procedure(s): HYSTERECTOMY TOTAL LAPAROSCOPIC WITH BILATERAL SALPINGECTOMY (Bilateral) END DESCENDING COLOSTOMY (N/A) APPLICATION OF WOUND VAC PROCTOSCOPY (N/A)  SURGEON:  Surgeon(s) and Role: Panel 1:    * Sloan Leiter, MD - Primary    * Aletha Halim, MD - Assisting Panel 2:    * Rolm Bookbinder, MD - Primary    * Cornett, Marcello Moores, MD - Assisting  PHYSICIAN ASSISTANT:   ASSISTANTS: none   ANESTHESIA:   general  EBL:  900 mL   BLOOD ADMINISTERED:none  DRAINS: (1) Jackson-Pratt drain(s) with closed bulb suction in the right lower quadrant   LOCAL MEDICATIONS USED:  NONE  SPECIMEN:  Source of Specimen:  uterus, cervix, bilateral fallopian tubes  DISPOSITION OF SPECIMEN:  PATHOLOGY  COUNTS:  YES  TOURNIQUET:  * No tourniquets in log *  DICTATION: .Note written in EPIC  PLAN OF CARE: Admit to inpatient   PATIENT DISPOSITION:  PACU - hemodynamically stable.   Delay start of Pharmacological VTE agent (>24hrs) due to surgical blood loss or risk of bleeding: yes

## 2019-03-09 NOTE — Transfer of Care (Signed)
Immediate Anesthesia Transfer of Care Note  Patient: Tasha Payne  Procedure(s) Performed: HYSTERECTOMY TOTAL LAPAROSCOPIC WITH BILATERAL SALPINGECTOMY (Bilateral Abdomen) ILEO LOOP COLOSTOMY  Patient Location: PACU  Anesthesia Type:General  Level of Consciousness: sedated and patient cooperative  Airway & Oxygen Therapy: Patient Spontanous Breathing and Patient connected to face mask oxygen  Post-op Assessment: Report given to RN and Post -op Vital signs reviewed and stable  Post vital signs: Reviewed and stable  Last Vitals:  Vitals Value Taken Time  BP    Temp    Pulse 85 03/09/19 1850  Resp 17 03/09/19 1850  SpO2 100 % 03/09/19 1850  Vitals shown include unvalidated device data.  Last Pain:  Vitals:   03/09/19 1039  TempSrc: Oral  PainSc: 0-No pain      Patients Stated Pain Goal: 1 (08/31/81 5003)  Complications: No apparent anesthesia complications

## 2019-03-09 NOTE — Anesthesia Postprocedure Evaluation (Signed)
Anesthesia Post Note  Patient: Tasha Payne  Procedure(s) Performed: HYSTERECTOMY TOTAL LAPAROSCOPIC WITH BILATERAL SALPINGECTOMY (Bilateral Abdomen) CYSTOSCOPY (Urethra) END DESCENDING COLOSTOMY (N/A Abdomen) APPLICATION OF WOUND VAC (Abdomen) PROCTOSCOPY (N/A Rectum)     Patient location during evaluation: PACU Anesthesia Type: General Level of consciousness: sedated, patient cooperative and oriented Pain management: pain level controlled Vital Signs Assessment: post-procedure vital signs reviewed and stable Respiratory status: spontaneous breathing, nonlabored ventilation and respiratory function stable Cardiovascular status: blood pressure returned to baseline and stable Postop Assessment: no apparent nausea or vomiting and adequate PO intake (sipping clear liquids) Anesthetic complications: no Comments: complicated surgery, pt reports no problems with anesthesia    Last Vitals:  Vitals:   03/09/19 2000 03/09/19 2038  BP: 138/90 (!) 142/86  Pulse: 66 60  Resp: 15 18  Temp:  36.4 C  SpO2: 100% 99%    Last Pain:  Vitals:   03/09/19 2038  TempSrc: Oral  PainSc:                  Isaid Salvia,E. Shaela Boer

## 2019-03-10 LAB — CBC
HCT: 28.7 % — ABNORMAL LOW (ref 36.0–46.0)
Hemoglobin: 9.9 g/dL — ABNORMAL LOW (ref 12.0–15.0)
MCH: 28.9 pg (ref 26.0–34.0)
MCHC: 34.5 g/dL (ref 30.0–36.0)
MCV: 83.7 fL (ref 80.0–100.0)
Platelets: 193 10*3/uL (ref 150–400)
RBC: 3.43 MIL/uL — ABNORMAL LOW (ref 3.87–5.11)
RDW: 12.9 % (ref 11.5–15.5)
WBC: 12.6 10*3/uL — ABNORMAL HIGH (ref 4.0–10.5)
nRBC: 0 % (ref 0.0–0.2)

## 2019-03-10 LAB — BASIC METABOLIC PANEL
Anion gap: 10 (ref 5–15)
BUN: 5 mg/dL — ABNORMAL LOW (ref 6–20)
CO2: 26 mmol/L (ref 22–32)
Calcium: 8.5 mg/dL — ABNORMAL LOW (ref 8.9–10.3)
Chloride: 104 mmol/L (ref 98–111)
Creatinine, Ser: 0.8 mg/dL (ref 0.44–1.00)
GFR calc Af Amer: >60 mL/min (ref >60)
GFR calc non Af Amer: >60 mL/min (ref >60)
Glucose, Bld: 151 mg/dL — ABNORMAL HIGH (ref 70–99)
Potassium: 3.4 mmol/L — ABNORMAL LOW (ref 3.5–5.1)
Sodium: 140 mmol/L (ref 135–145)

## 2019-03-10 MED ORDER — OXYCODONE HCL 5 MG PO TABS
10.0000 mg | ORAL_TABLET | ORAL | Status: DC | PRN
  Administered 2019-03-10 – 2019-03-15 (×17): 10 mg via ORAL
  Filled 2019-03-10 (×18): qty 2

## 2019-03-10 MED ORDER — OXYCODONE HCL 5 MG PO TABS
5.0000 mg | ORAL_TABLET | ORAL | Status: DC | PRN
  Administered 2019-03-10 – 2019-03-15 (×2): 5 mg via ORAL
  Filled 2019-03-10 (×2): qty 1

## 2019-03-10 NOTE — TOC Initial Note (Addendum)
Transition of Care [facility]) - Initial/Assessment Note    Patient Details  Name: Tasha Payne MRN: [id number] Date of Birth: 10-Jun-1971  Transition of Care [facility]) CM/SW Contact:    Marilu Favre, RN Phone Number: 03/10/2019, 1:56 PM  Clinical Narrative:                 Spoke to patient at bedside.Confirmed EPICC information.   Patient does not have a PCP. Explained she can call number on insurance card card and be provided at list of MD's in network with her insurance. Also, if she knows of a MD she would like to follow up with she can call the office directly and see they are accepting new patients with her insurance. Also provided [facility] number if she wants a [place] MD.   Patient plans on staying with her mother at discharge. Address is : [address], [place], [place] [address] . Patient's cell phone number is [phone].  Patient has an order for home health RN for wet to dry dressing changes BID and new ostomy care. Provided Medicare.gov home health list. Patient has no preference.   Explained to patient home health nurse will not make daily visits so her bedside nurse here at the hospital will teach her and her mother wound and ostomy care.   Patient voices understanding.  Spoke with PA, potential discharge Saturday pending bowel function etc.   Called: 1, [facility] spoke with Tommi Rumps , due to holiday weekend they have no staff to accept referral. 2, [facility] spoke with Jonelle Sidle , Jonelle Sidle unable to accept referral due to staffing. 3, Spoke with Butch Penny at [facility], she is unable to accept referral. 4, Spoke with Malachy Mood at [facility] due to insurance she is unable to accept referral. 5, Spoke with Blanchard Mane at [facility], [facility] is out of network with insurance.  6, Spoke with Dian Situ at [facility], he is unable to accept due to staffing. 7, Spoke with Judson Roch at [facility] she is unable to accept due to staffing. 8, Spoke with Hoyle Sauer at [facility] she is unable to accept referral due to insurance. 9, Spoke to [place] at [facility] she is unable to accept referral due to staffing. 10, Spoke to [place] at [facility], she is unable to accept due to staffing. 11, Left Dorian Pod with [facility] a message. Awaiting call back.Dorian Pod called back and cannot take patient due to insurance.  Unable to arrange home health. Case Management lead Hassan Rowan aware.  Will message [facility] PA   Expected Discharge Plan: [place] Barriers to Discharge: Continued Medical Work up   Patient Goals and CMS Choice Patient states their goals for this hospitalization and ongoing recovery are:: to return to home CMS Medicare.gov Compare Post Acute Care list provided to:: Patient Choice offered to / list presented to : Patient  Expected Discharge Plan and Services Expected Discharge Plan: [place]   Discharge Planning Services: CM Consult Post Acute Care Choice: [place] arrangements for the past 2 months: Single Family Home                 DME Arranged: N/A         HH Arranged: Higher education careers adviser spoke with at [place]: see progress note  Prior Living Arrangements/Services Living arrangements for the past 2 months: [place] Lives with:: Parents Patient language  and need for interpreter reviewed:: Yes Do you feel safe going back to the place where you live?: Yes      Need for Family Participation in Patient Care: Yes (Comment) Care giver support system in place?: Yes (comment)   Criminal Activity/Legal Involvement Pertinent to Current Situation/Hospitalization: No - Comment as needed  Activities of Daily Living Home Assistive Devices/Equipment: None ADL Screening (condition at time of admission) Patient's cognitive ability adequate to safely complete daily activities?: Yes Is the patient deaf or have difficulty hearing?: No Does the patient have  difficulty seeing, even when wearing glasses/contacts?: No Does the patient have difficulty concentrating, remembering, or making decisions?: No Patient able to express need for assistance with ADLs?: Yes Does the patient have difficulty dressing or bathing?: No Independently performs ADLs?: Yes (appropriate for developmental age) Does the patient have difficulty walking or climbing stairs?: No Weakness of Legs: None Weakness of Arms/Hands: None  Permission Sought/Granted   Permission granted to share information with : Yes, Verbal Permission Granted              Emotional Assessment Appearance:: Appears stated age Attitude/Demeanor/Rapport: Engaged Affect (typically observed): Accepting Orientation: : Oriented to Self, Oriented to Place, Oriented to  Time, Oriented to Situation Alcohol / Substance Use: Not Applicable Psych Involvement: No (comment)  Admission diagnosis:  AUB Patient Active Problem List   Diagnosis Date Noted  . Surgical complication 21/30/8657  . Abnormal uterine bleeding (AUB) 03/09/2019  . S/P hysterectomy 03/09/2019  . S/P laparoscopic hysterectomy 03/09/2019  . Iron deficiency anemia due to chronic blood loss 01/19/2018  . Symptomatic anemia 01/18/2018  . Menorrhagia 01/18/2018  . Uterine fibroid 01/18/2018   PCP:  Patient, No Pcp Per Pharmacy:   [id number] [address], [place] [address] Phone: [phone] Fax: [phone]     Social Determinants of Health (SDOH) Interventions    Readmission Risk Interventions No flowsheet data found.

## 2019-03-10 NOTE — Plan of Care (Signed)
  Problem: Clinical Measurements: Goal: Ability to maintain clinical measurements within normal limits will improve Outcome: Progressing   Problem: Pain Managment: Goal: General experience of comfort will improve Outcome: Progressing   

## 2019-03-10 NOTE — Progress Notes (Signed)
[facility]/Trauma Progress Note  1 Day Post-Op   Assessment/Plan POD#1 total laparoscopic hysterectomy, bilateral salpingectomy, converted to open, proctoscopy, end descending colostomy, cystoscopy, Dr. Vivien Rota & Dr. Donne Hazel, 07/01 - WOC consult for midline wound (wet to dry) and ostomy - CLD, advance when pt is having ostomy output - IS, ambulate  FEN: CLD VTE: SCD's, lovenox ID: Zosyn for 5 days 07/01>> Foley: dc today Follow up: Dr. Donne Hazel  DISPO: continue IV abx and drain. Ambulate. IS.     LOS: 1 day    Subjective: CC: abdominal pain  Pt states no issues overnight. No vomiting. She does not remember much after surgery. I discussed Dr. Cristal Generous role in her surgery yesterday, midline wound, prognosis, and follow up. She expressed understanding. She did not have any questions for me.   Objective: Vital signs in last 24 hours: Temp:  [97.6 F (36.4 C)-99 F (37.2 C)] 98 F (36.7 C) (07/02 0529) Pulse Rate:  [60-93] 93 (07/02 0529) Resp:  [15-22] 18 (07/02 0529) BP: (126-148)/(72-98) 134/83 (07/02 0529) SpO2:  [98 %-100 %] 100 % (07/02 0529) Weight:  [73.6 kg] 73.6 kg (07/01 1039) Last BM Date: (pta)  Intake/Output from previous day: 07/01 0701 - 07/02 0700 In: 4953 [I.V.:4403; IV Piggyback:550] Out: 4090 [Urine:2950; Drains:240; Blood:900] Intake/Output this shift: No intake/output data recorded.  PE: Gen:  Alert, NAD, pleasant, cooperative, tearful  Pulm:  Rate and effort normal Abd: Soft, ND, +BS, midline is packed, stoma is pink, no gas or stool in bag, lower abdominal TTP, no peritonitis, drain with scant serosanguinous drainage Skin: no rashes noted, warm and dry   Anti-infectives: Anti-infectives (From admission, onward)   Start     Dose/Rate Route Frequency Ordered Stop   03/09/19 2100  piperacillin-tazobactam (ZOSYN) IVPB 3.375 g     3.375 g 12.5 mL/hr over 240 Minutes Intravenous Every 8 hours 03/09/19 2048     03/09/19  1600  cefOXitin (MEFOXIN) 2 g in sodium chloride 0.9 % 100 mL IVPB     2 g 200 mL/hr over 30 Minutes Intravenous  Once 03/09/19 1555 03/09/19 1641   03/09/19 1045  ceFAZolin (ANCEF) IVPB 2g/100 mL premix     2 g 200 mL/hr over 30 Minutes Intravenous On call to O.R. 03/09/19 1043 03/09/19 1309      Lab Results:  Recent Labs    03/09/19 1900 03/10/19 0514  WBC 18.2* 12.6*  HGB 10.6* 9.9*  HCT 30.7* 28.7*  PLT 302 193   BMET Recent Labs    03/09/19 1900 03/10/19 0514  NA 139 140  K 3.5 3.4*  CL 107 104  CO2 23 26  GLUCOSE 198* 151*  BUN 5* 5*  CREATININE 0.82 0.80  CALCIUM 8.2* 8.5*   PT/INR No results for input(s): LABPROT, INR in the last 72 hours. CMP     Component Value Date/Time   NA 140 03/10/2019 0514   K 3.4 (L) 03/10/2019 0514   CL 104 03/10/2019 0514   CO2 26 03/10/2019 0514   GLUCOSE 151 (H) 03/10/2019 0514   BUN 5 (L) 03/10/2019 0514   CREATININE 0.80 03/10/2019 0514   CALCIUM 8.5 (L) 03/10/2019 0514   GFRNONAA >60 03/10/2019 0514   GFRAA >60 03/10/2019 0514   Lipase  No results found for: LIPASE  Studies/Results: Dg Abd Portable 1v  Result Date: 03/09/2019 CLINICAL DATA:  48 year old female with history of hysterectomy. Incorrect instrument count. Evaluate for foreign body. EXAM: PORTABLE ABDOMEN - 1 VIEW COMPARISON:  No  priors. FINDINGS: Two views of the abdomen demonstrate a surgical drain coiled in the pelvis with tip extending into the mid abdomen. Surgical clip in the mid central pelvis. Additional surgical staple projecting over the left lateral abdomen. No other radiopaque foreign body identified. Gas in the flank soft tissues bilaterally. Gas and stool noted throughout the colon extending into the distal rectum. No pathologic dilatation of small bowel or colon. IMPRESSION: 1. Postoperative changes, as above. No definite unexpected radiopaque foreign body, as above. These results were called by telephone at the time of interpretation on 03/09/2019  at 6:34 pm to nurse Chastity for Dr. Vivien Rota, who verbally acknowledged these results. Electronically Signed   By: Vinnie Langton M.D.   On: 03/09/2019 18:46      Kalman Drape , [facility] 03/10/2019, 8:30 AM  Pager: [phone] Mon-Wed, Friday 7:00am-4:30pm Thurs 7am-11:30am  Consults: [phone]

## 2019-03-10 NOTE — Progress Notes (Signed)
Received pt from PACU Womens, AOX3, vital signs stable, in moderate pain. Oriented to room and plan of care, call bell at reach. Given PRN medication toradol 30 mg, pt lying comfortably in bed. Will continue to monitor.

## 2019-03-10 NOTE — Progress Notes (Signed)
Gynecology Progress Note  Admission Date: 03/09/2019 Current Date: 03/10/2019 7:57 AM  Beyounce DEARA BOBER is a 48 y.o. [id number] HD#2 admitted for total laparoscopic hysterectomy, bilateral salpingectomy converted to open, end descending colostomy, proctoscopy, cystoscopy.   History complicated by: Patient Active Problem List   Diagnosis Date Noted   Surgical complication 45/40/9811   Abnormal uterine bleeding (AUB) 03/09/2019   S/P hysterectomy 03/09/2019   S/P laparoscopic hysterectomy 03/09/2019   Iron deficiency anemia due to chronic blood loss 01/19/2018   Symptomatic anemia 01/18/2018   Menorrhagia 01/18/2018   Uterine fibroid 01/18/2018    Subjective:  Patient very sore this am. Did not get much sleep. Reports generalized abdominal pain that is improved with pain meds. Denies nausea/vomiting. Tolerating liquids.  Has not been out of bed.   Objective:   Vitals:   03/09/19 2000 03/09/19 2038 03/10/19 0001 03/10/19 0529  BP: 138/90 (!) 142/86 (!) 142/98 134/83  Pulse: 66 60 74 93  Resp: 15 18 17 18   Temp:  97.6 F (36.4 C) 97.8 F (36.6 C) 98 F (36.7 C)  TempSrc:  Oral Oral Oral  SpO2: 100% 99% 99% 100%  Weight:      Height:        Physical exam: BP 134/83 (BP Location: Left Arm)    Pulse 93    Temp 98 F (36.7 C) (Oral)    Resp 18    Ht 5\' 4"  (1.626 m)    Wt 73.6 kg    LMP 02/02/2019 (Approximate)    SpO2 100%    BMI 27.85 kg/m  CONSTITUTIONAL: Well-developed, well-nourished female in no acute distress.  HENT:  Normocephalic, atraumatic, External right and left ear normal. Oropharynx is clear and moist EYES: Conjunctivae and EOM are normal. Pupils are equal, round, and reactive to light. No scleral icterus.  NECK: Normal range of motion, supple, no masses.  Normal thyroid.  SKIN: Skin is warm and dry. No rash noted. Not diaphoretic. No erythema. No pallor. NEUROLOGIC: Alert and oriented to person, place, and time. Normal reflexes, muscle tone coordination. No  cranial nerve deficit noted. PSYCHIATRIC: Normal mood and affect. Normal behavior. Normal judgment and thought content. CARDIOVASCULAR: Normal heart rate note RESPIRATORY: Effort normal, no problems with respiration noted. ABDOMEN: Soft and appropriately grossly tender. Ostomy pink and moist. No active bleeding from midline vertical incision noted with ABD over it, did not remove packing but appears well under ABD. No rebound or guarding.  PELVIC: vaginal packing removed with small amount dark red blood on packing MUSCULOSKELETAL: Normal range of motion. No tenderness.  No cyanosis, clubbing, or edema.    UOP: 1000 mL overnight JP: 100 mL overnight  Labs  Recent Labs  Lab 03/08/19 1233 03/09/19 1856 03/09/19 1900 03/10/19 0514  WBC 6.0  --  18.2* 12.6*  HGB 14.4 10.3* 10.6* 9.9*  HCT 42.5  --  30.7* 28.7*  PLT 211  --  302 193    Assessment & Plan:   Patient is 48 y.o. [id number] POD#1 s/p total laparoscopic hysterectomy, bilateral salpingectomy converted to open, end descending colostomy, proctoscopy, cystoscopy. She is doing well, appropriately sore this am but belly remains soft. H/H stable, VSS. Vaginal packing removed with no concern for acute bleed. Open incision appears well, ostomy appears well.   Patient more alert this am, I reviewed in detail case, course, surgical complications and repair by general surgery. Reviewed expected hospital course, long term recovery planning. Patient very appropriate and verbalizes understanding of the  above.   *GYN: s/p vaginal packing  *Drain/Wound: remove foley this am, JP per general surgery, wound/ostomy nurse to see patient today for ostomy and open incision *Pain: IV dilaudid, toradol *FEN/GI: clear liquids, protonix *PPx: lovenox *Abx: zosyn per pharmacy *Dispo: inpatient   Appreciate General Surgery care.    Feliz Beam, M.D. Attending Center for [employer] Fish farm manager) [phone]

## 2019-03-10 NOTE — Progress Notes (Signed)
   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  Pt was likely napping, would prefer a visit at a different time.  Myra Gianotti resident, [phone]

## 2019-03-10 NOTE — Consult Note (Addendum)
[place] Nurse wound consult note Reason for Consult: Consult for abd wound; pt had surgery yesterday and Vac was removed in PACU since it was not functioning according to progress notes.  Surgery team is following for assessment and plan of care and has ordered moist gauze dressings.  Wound type: Full thickness midline abd wound Measurement: 8X1X3cm Wound bed: beefy red Drainage (amount, consistency, odor) small amt pink drainage, no odor Periwound: intact skin surrounding Dressing procedure/placement/frequency: Moist gauze packing applied using swab to fill.  Orders provided for bedside nurses to perform daily. Abd wound is located in close proximity to colostomy.  [place] Nurse ostomy consult note Stoma type/location:  Colostomy surgery was performed yesterday Stomal assessment/size: Stoma is red and viable, slightly above skin level, 1 3/4 inches Peristomal assessment: intact skin surrounding; there is a crease at 9:00 o'clock.  Current pouch was leaking behind the barrier Output: No stool or flatus, small amt pink drainage  Ostomy pouching: 1pc. with barrier ring added to attempt to maintain a seal  Education provided: Demonstrated pouch change, using one piece flexible pouch and barrier ring.  Pt was able to open and close velcro to empty. Discussed pouching routines and ordering supplies. Enrolled patient in [place] program: Yes [place] team will continue to follow for further teaching sessions.  Julien Girt MSN, RN, [place], [place], [place]

## 2019-03-10 NOTE — Progress Notes (Signed)
Up to see patient, she reports pain is moderately well controlled but tolerating clears, voiding without issue. She reports understanding of the plan by general surgery. No questions for me at this time. Per my earlier conversation with Darrick Grinder, I gave patient Amy's number and she will call at her convenience.    Feliz Beam, M.D. Attending Center for [employer] Fish farm manager) [phone]

## 2019-03-11 LAB — CBC
HCT: 25.1 % — ABNORMAL LOW (ref 36.0–46.0)
HCT: 26.3 % — ABNORMAL LOW (ref 36.0–46.0)
Hemoglobin: 8.6 g/dL — ABNORMAL LOW (ref 12.0–15.0)
Hemoglobin: 9 g/dL — ABNORMAL LOW (ref 12.0–15.0)
MCH: 28.8 pg (ref 26.0–34.0)
MCH: 28.8 pg (ref 26.0–34.0)
MCHC: 34.3 g/dL (ref 30.0–36.0)
MCHC: 34.2 g/dL (ref 30.0–36.0)
MCV: 83.9 fL (ref 80.0–100.0)
MCV: 84.3 fL (ref 80.0–100.0)
Platelets: 157 10*3/uL (ref 150–400)
Platelets: 167 10*3/uL (ref 150–400)
RBC: 2.99 MIL/uL — ABNORMAL LOW (ref 3.87–5.11)
RBC: 3.12 MIL/uL — ABNORMAL LOW (ref 3.87–5.11)
RDW: 12.9 % (ref 11.5–15.5)
RDW: 12.9 % (ref 11.5–15.5)
WBC: 8.3 10*3/uL (ref 4.0–10.5)
WBC: 9.7 10*3/uL (ref 4.0–10.5)
nRBC: 0 % (ref 0.0–0.2)
nRBC: 0 % (ref 0.0–0.2)

## 2019-03-11 LAB — BASIC METABOLIC PANEL
Anion gap: 8 (ref 5–15)
BUN: <5 mg/dL — ABNORMAL LOW (ref 6–20)
CO2: 25 mmol/L (ref 22–32)
Calcium: 8.4 mg/dL — ABNORMAL LOW (ref 8.9–10.3)
Chloride: 105 mmol/L (ref 98–111)
Creatinine, Ser: 0.75 mg/dL (ref 0.44–1.00)
GFR calc Af Amer: >60 mL/min (ref >60)
GFR calc non Af Amer: >60 mL/min (ref >60)
Glucose, Bld: 129 mg/dL — ABNORMAL HIGH (ref 70–99)
Potassium: 3 mmol/L — ABNORMAL LOW (ref 3.5–5.1)
Sodium: 138 mmol/L (ref 135–145)

## 2019-03-11 MED ORDER — POTASSIUM CHLORIDE CRYS ER 20 MEQ PO TBCR
30.0000 meq | EXTENDED_RELEASE_TABLET | Freq: Two times a day (BID) | ORAL | Status: DC
  Administered 2019-03-11 – 2019-03-15 (×9): 30 meq via ORAL
  Filled 2019-03-11 (×9): qty 1

## 2019-03-11 MED ORDER — LACTATED RINGERS IV SOLN
INTRAVENOUS | Status: DC
  Administered 2019-03-11: 21:00:00 via INTRAVENOUS

## 2019-03-11 NOTE — Consult Note (Signed)
[facility] Nurse ostomy follow up Patient is sitting up, bathing herself this AM.  Stoma type/location: LLQ colsotomy Stomal assessment/size: 1 3/8" pink moist and blood tinged liquid in pouch Peristomal assessment: not assessed Treatment options for stomal/peristomal skin: barrier ring  Output blood tinged liquid in pouch Ostomy pouching: 1pc.flat with barrier ring Education provided: Patient able to empty. She ismore optimistic today.  We discussed reconnection in the coming months.  She has no questions today.  Enrolled patient in [place] Start Discharge program: Yes [place] team will follow.   Domenic Moras MSN, RN, FNP-BC [facility] Wound, Ostomy, Continence Nurse Pager [phone]

## 2019-03-11 NOTE — Progress Notes (Signed)
Patient ID: Tasha Payne, female   DOB: Feb 04, 1971, 48 y.o.   MRN: [id number] [facility] Progress Note:   2 Days Post-Op  Subjective: Mental status is clear Objective: Vital signs in last 24 hours: Temp:  [98.4 F (36.9 C)-99.2 F (37.3 C)] 98.4 F (36.9 C) (07/03 0508) Pulse Rate:  [82-110] 82 (07/03 0508) Resp:  [18-20] 18 (07/03 0508) BP: (120-152)/(67-89) 120/67 (07/03 0508) SpO2:  [98 %-100 %] 98 % (07/03 0508)  Intake/Output from previous day: 07/02 0701 - 07/03 0700 In: 360 [P.O.:360] Out: 90 [Drains:90] Intake/Output this shift: No intake/output data recorded.  Physical Exam: Work of breathing is normal;  Up walking about the floor without difficulty  Lab Results:  Results for orders placed or performed during the hospital encounter of 03/09/19 (from the past 48 hour(s))  Prepare RBC (crossmatch)     Status: None   Collection Time: 03/09/19  4:44 PM  Result Value Ref Range   Order Confirmation      ORDER PROCESSED BY BLOOD BANK Performed at [facility], [address], [place], [place] [address]   Hemoglobin-hemacue, POC     Status: Abnormal   Collection Time: 03/09/19  6:56 PM  Result Value Ref Range   Hemoglobin 10.3 (L) 12.0 - 15.0 g/dL  CBC     Status: Abnormal   Collection Time: 03/09/19  7:00 PM  Result Value Ref Range   WBC 18.2 (H) 4.0 - 10.5 K/uL   RBC 3.63 (L) 3.87 - 5.11 MIL/uL   Hemoglobin 10.6 (L) 12.0 - 15.0 g/dL    Comment: REPEATED TO VERIFY   HCT 30.7 (L) 36.0 - 46.0 %   MCV 84.6 80.0 - 100.0 fL   MCH 29.2 26.0 - 34.0 pg   MCHC 34.5 30.0 - 36.0 g/dL   RDW 33.2 95.1 - 88.4 %   Platelets 302 150 - 400 K/uL   nRBC 0.0 0.0 - 0.2 %    Comment: Performed at [facility], [address], [place], [place] [address]  Basic metabolic panel     Status: Abnormal   Collection Time: 03/09/19  7:00 PM  Result Value Ref Range   Sodium 139 135 - 145 mmol/L   Potassium 3.5 3.5 - 5.1 mmol/L   Chloride 107 98 - 111 mmol/L   CO2  23 22 - 32 mmol/L   Glucose, Bld 198 (H) 70 - 99 mg/dL   BUN 5 (L) 6 - 20 mg/dL   Creatinine, Ser 3.01 0.44 - 1.00 mg/dL   Calcium 8.2 (L) 8.9 - 10.3 mg/dL   GFR calc non Af Amer >60 >60 mL/min   GFR calc Af Amer >60 >60 mL/min   Anion gap 9 5 - 15    Comment: Performed at [facility], [address] [place], [place] [address]  CBC     Status: Abnormal   Collection Time: 03/10/19  5:14 AM  Result Value Ref Range   WBC 12.6 (H) 4.0 - 10.5 K/uL   RBC 3.43 (L) 3.87 - 5.11 MIL/uL   Hemoglobin 9.9 (L) 12.0 - 15.0 g/dL   HCT 32.3 (L) 55.7 - 32.2 %   MCV 83.7 80.0 - 100.0 fL   MCH 28.9 26.0 - 34.0 pg   MCHC 34.5 30.0 - 36.0 g/dL   RDW 02.5 42.7 - 06.2 %   Platelets 193 150 - 400 K/uL   nRBC 0.0 0.0 - 0.2 %    Comment: Performed at  [facility], [address] [place], [place] [address]  Basic metabolic panel     Status: Abnormal   Collection Time: 03/10/19  5:14 AM  Result Value Ref Range   Sodium 140 135 - 145 mmol/L   Potassium 3.4 (L) 3.5 - 5.1 mmol/L   Chloride 104 98 - 111 mmol/L   CO2 26 22 - 32 mmol/L   Glucose, Bld 151 (H) 70 - 99 mg/dL   BUN 5 (L) 6 - 20 mg/dL   Creatinine, Ser 6.43 0.44 - 1.00 mg/dL   Calcium 8.5 (L) 8.9 - 10.3 mg/dL   GFR calc non Af Amer >60 >60 mL/min   GFR calc Af Amer >60 >60 mL/min   Anion gap 10 5 - 15    Comment: Performed at [facility], [address] [place], [place] [address]  Basic metabolic panel     Status: Abnormal   Collection Time: 03/11/19  4:30 AM  Result Value Ref Range   Sodium 138 135 - 145 mmol/L   Potassium 3.0 (L) 3.5 - 5.1 mmol/L   Chloride 105 98 - 111 mmol/L   CO2 25 22 - 32 mmol/L   Glucose, Bld 129 (H) 70 - 99 mg/dL   BUN <5 (L) 6 - 20 mg/dL   Creatinine, Ser 8.84 0.44 - 1.00 mg/dL   Calcium 8.4 (L) 8.9 - 10.3 mg/dL   GFR calc non Af Amer >60 >60 mL/min   GFR calc Af Amer >60 >60 mL/min   Anion gap 8 5 - 15    Comment: Performed at [facility], [address], [place], [place]  [address]  CBC     Status: Abnormal   Collection Time: 03/11/19  4:30 AM  Result Value Ref Range   WBC 8.3 4.0 - 10.5 K/uL   RBC 2.99 (L) 3.87 - 5.11 MIL/uL   Hemoglobin 8.6 (L) 12.0 - 15.0 g/dL   HCT 30.1 (L) 60.1 - 09.3 %   MCV 83.9 80.0 - 100.0 fL   MCH 28.8 26.0 - 34.0 pg   MCHC 34.3 30.0 - 36.0 g/dL   RDW 23.5 57.3 - 22.0 %   Platelets 157 150 - 400 K/uL   nRBC 0.0 0.0 - 0.2 %    Comment: Performed at [facility] Lab, [address], [place], [place] [address]    Radiology/Results: Dg Abd Portable 1v  Result Date: 03/09/2019 CLINICAL DATA:  48 year old female with history of hysterectomy. Incorrect instrument count. Evaluate for foreign body. EXAM: PORTABLE ABDOMEN - 1 VIEW COMPARISON:  No priors. FINDINGS: Two views of the abdomen demonstrate a surgical drain coiled in the pelvis with tip extending into the mid abdomen. Surgical clip in the mid central pelvis. Additional surgical staple projecting over the left lateral abdomen. No other radiopaque foreign body identified. Gas in the flank soft tissues bilaterally. Gas and stool noted throughout the colon extending into the distal rectum. No pathologic dilatation of small bowel or colon. IMPRESSION: 1. Postoperative changes, as above. No definite unexpected radiopaque foreign body, as above. These results were called by telephone at the time of interpretation on 03/09/2019 at 6:34 pm to nurse Chastity for Dr. Leroy Libman, who verbally acknowledged these results. Electronically Signed   By: Trudie Reed M.D.   On: 03/09/2019 18:46    Anti-infectives: Anti-infectives (From admission, onward)   Start     Dose/Rate Route Frequency Ordered Stop   03/09/19 2100  piperacillin-tazobactam (ZOSYN) IVPB 3.375  g     3.375 g 12.5 mL/hr over 240 Minutes Intravenous Every 8 hours 03/09/19 2048     03/09/19 1600  cefOXitin (MEFOXIN) 2 g in sodium chloride 0.9 % 100 mL IVPB     2 g 200 mL/hr over 30 Minutes Intravenous  Once 03/09/19 1555 03/09/19  1641   03/09/19 1045  ceFAZolin (ANCEF) IVPB 2g/100 mL premix     2 g 200 mL/hr over 30 Minutes Intravenous On call to O.R. 03/09/19 1043 03/09/19 1309      Assessment/Plan: Problem List: Patient Active Problem List   Diagnosis Date Noted  . Surgical complication 03/09/2019  . Abnormal uterine bleeding (AUB) 03/09/2019  . S/P hysterectomy 03/09/2019  . S/P laparoscopic hysterectomy 03/09/2019  . Iron deficiency anemia due to chronic blood loss 01/19/2018  . Symptomatic anemia 01/18/2018  . Menorrhagia 01/18/2018  . Uterine fibroid 01/18/2018    Mobilizing after colostomy and repair of rectal injury and hysterectomy 2 Days Post-Op    LOS: 2 days   Matt B. Daphine Deutscher, MD, [facility] [phone] beeper [phone]  03/11/2019 10:07 AM

## 2019-03-11 NOTE — Progress Notes (Signed)
Gynecology Progress Note  Admission Date: 03/09/2019 Current Date: 03/11/2019 10:08 AM  Tasha Payne is a 48 y.o. [id number] HD#3 admitted for total laparoscopic hysterectomy, bilateral salpingectomy converted to open, end descending colostomy, proctoscopy, cystoscopy.   History complicated by: Patient Active Problem List   Diagnosis Date Noted  . Surgical complication 96/29/5284  . Abnormal uterine bleeding (AUB) 03/09/2019  . S/P hysterectomy 03/09/2019  . S/P laparoscopic hysterectomy 03/09/2019  . Iron deficiency anemia due to chronic blood loss 01/19/2018  . Symptomatic anemia 01/18/2018  . Menorrhagia 01/18/2018  . Uterine fibroid 01/18/2018    Subjective:  Patient feeling better this am. Reports she is moving around with minimal light-headedness. Her pain is under better control and is manageable. She is voiding without issue. Feels gas moving in abdomen, thinks she may have passed gas through ostomy once but not sure. Tolerating full liquids without any nausea or vomiting. Reports minimal vaginal spotting.   Objective:   Vitals:   03/10/19 1450 03/10/19 1819 03/10/19 2010 03/11/19 0508  BP: (!) 141/76 (!) 144/74 (!) 141/84 120/67  Pulse: (!) 109 98 96 82  Resp: 18 20 20 18   Temp: 99.1 F (37.3 C) 99.2 F (37.3 C) 98.9 F (37.2 C) 98.4 F (36.9 C)  TempSrc: Oral Oral Oral Oral  SpO2: 100% 99% 100% 98%  Weight:      Height:         Physical exam: BP 120/67 (BP Location: Left Arm)   Pulse 82   Temp 98.4 F (36.9 C) (Oral)   Resp 18   Ht 5\' 4"  (1.626 m)   Wt 73.6 kg   LMP 02/02/2019 (Approximate)   SpO2 98%   BMI 27.85 kg/m  CONSTITUTIONAL: Well-developed, well-nourished female in mild distress, appears fatigued moving easier than yesterday.   NECK: Normal range of motion, supple, no masses.  Normal thyroid.  SKIN: Skin is warm and dry. No rash noted. Not diaphoretic. No erythema. No pallor. NEUROLOGIC: Alert and oriented to person, place, and time. Normal  reflexes, muscle tone coordination. No cranial nerve deficit noted. PSYCHIATRIC: Normal mood and affect. Normal behavior. Normal judgment and thought content. CARDIOVASCULAR: Normal heart rate noted, regular rhythm, tachycardic after movement RESPIRATORY: Clear to auscultation bilaterally. Effort and breath sounds normal, no problems with respiration noted. ABDOMEN: Soft with slight distension, hypoactive bowel sounds, appropriately tender, no rebound or guarding. Wound dressing not disrupted (replaced this am by RN), no evidence of active bleeding, ostomy beefy red and moist.  PELVIC: deferred MUSCULOSKELETAL: Normal range of motion. No tenderness.  No cyanosis, clubbing, or edema.    UOP: voiding spontaneously JP: 50 mL overnight   Labs  Recent Labs  Lab 03/09/19 1900 03/10/19 0514 03/11/19 0430  WBC 18.2* 12.6* 8.3  HGB 10.6* 9.9* 8.6*  HCT 30.7* 28.7* 25.1*  PLT 302 193 157    Assessment & Plan:   Patient is 48 y.o. [id number] POD#2 s/p total laparoscopic hysterectomy, bilateral salpingectomy converted to open, end descending colostomy, proctoscopy, cystoscopy. She is doing okay, reports she is feeling better than yesterday. Pain under better control and moving in room easier. WBC now normalized, drop in H/H since yesterday but abdomen is soft, suspect she is equalizing from blood loss with surgery. Will repeat CBC Q 12/ensure stability. Minimal bowel sounds on exam with no ostomy output but patient reports gurgling/gas in abdomen, which is encouraging. Hypokalemic, replete with PO potassium.   *GYN: s/p vaginal packing, no further repair at this time *Drain/Wound:  JP per general surgery, wound/ostomy team following *Pain: oxycodone, toradol *FEN/GI: full clears per gen surg, stop IVF, protonix, Kdur *PPx: lovenox *Abx: zosyn per pharmacy (for 5 day course) *Dispo: inpatient   Greatly appreciate General Surgery care.   Feliz Beam, M.D. Attending Center for [employer] Fish farm manager) [phone]

## 2019-03-12 LAB — BASIC METABOLIC PANEL
Anion gap: 7 (ref 5–15)
BUN: <5 mg/dL — ABNORMAL LOW (ref 6–20)
CO2: 24 mmol/L (ref 22–32)
Calcium: 8.5 mg/dL — ABNORMAL LOW (ref 8.9–10.3)
Chloride: 108 mmol/L (ref 98–111)
Creatinine, Ser: 0.72 mg/dL (ref 0.44–1.00)
GFR calc Af Amer: >60 mL/min (ref >60)
GFR calc non Af Amer: >60 mL/min (ref >60)
Glucose, Bld: 117 mg/dL — ABNORMAL HIGH (ref 70–99)
Potassium: 3.2 mmol/L — ABNORMAL LOW (ref 3.5–5.1)
Sodium: 139 mmol/L (ref 135–145)

## 2019-03-12 LAB — CBC
HCT: 24.3 % — ABNORMAL LOW (ref 36.0–46.0)
Hemoglobin: 8.4 g/dL — ABNORMAL LOW (ref 12.0–15.0)
MCH: 29.3 pg (ref 26.0–34.0)
MCHC: 34.6 g/dL (ref 30.0–36.0)
MCV: 84.7 fL (ref 80.0–100.0)
Platelets: 164 10*3/uL (ref 150–400)
RBC: 2.87 MIL/uL — ABNORMAL LOW (ref 3.87–5.11)
RDW: 12.7 % (ref 11.5–15.5)
WBC: 8 10*3/uL (ref 4.0–10.5)
nRBC: 0 % (ref 0.0–0.2)

## 2019-03-12 LAB — BPAM RBC
Blood Product Expiration Date: 202007302359
Blood Product Expiration Date: 202007312359
ISSUE DATE / TIME: 202007011651
ISSUE DATE / TIME: 202007011651
Unit Type and Rh: 6200
Unit Type and Rh: 6200

## 2019-03-12 LAB — TYPE AND SCREEN
ABO/RH(D): A POS
Antibody Screen: NEGATIVE
Unit division: 0
Unit division: 0

## 2019-03-12 NOTE — Progress Notes (Addendum)
Gynecology Progress Note  Admission Date: 03/09/2019 Current Date: 03/12/2019 9:24 AM  Tasha Payne is a 48 y.o. [id number] HD#4admitted fortotal laparoscopic hysterectomy, bilateral salpingectomy converted to open, end descending colostomy, proctoscopy, cystoscopy.   History complicated by: Patient Active Problem List   Diagnosis Date Noted  . Surgical complication 33/82/5053  . Abnormal uterine bleeding (AUB) 03/09/2019  . S/P hysterectomy 03/09/2019  . S/P laparoscopic hysterectomy 03/09/2019  . Iron deficiency anemia due to chronic blood loss 01/19/2018  . Symptomatic anemia 01/18/2018  . Menorrhagia 01/18/2018  . Uterine fibroid 01/18/2018    Subjective:  Feeling better this am. Walking in the hallway, denies light-headedness or dizziness. Pain under better control although she is still very sore. Tolerating full clears without nausea or vomiting. Passing "lots" of gas through ostomy, reports she feels some gas pain in abdomen but passing gas improves it. Voiding without issue. Minimal vaginal spotting.   Objective:   Vitals:   03/11/19 0508 03/11/19 1344 03/11/19 2117 03/12/19 0455  BP: 120/67 136/78 (!) 141/76 130/72  Pulse: 82 (!) 104 96 88  Resp: 18 18 18 18   Temp: 98.4 F (36.9 C) 99.2 F (37.3 C) 99.3 F (37.4 C) 98.3 F (36.8 C)  TempSrc: Oral Oral Oral Oral  SpO2: 98% 99% 99% 100%  Weight:      Height:        No intake/output data recorded.  Intake/Output Summary (Last 24 hours) at 03/12/2019 0924 Last data filed at 03/12/2019 9767 Gross per 24 hour  Intake 3149.02 ml  Output 65 ml  Net 3084.02 ml   Physical exam: BP 130/72 (BP Location: Left Arm)   Pulse 88   Temp 98.3 F (36.8 C) (Oral)   Resp 18   Ht 5\' 4"  (1.626 m)   Wt 73.6 kg   LMP 02/02/2019 (Approximate)   SpO2 100%   BMI 27.85 kg/m  CONSTITUTIONAL: Well-developed, well-nourished female in no acute distress.  SKIN: Skin is warm and dry. No rash noted. Not diaphoretic. No erythema. No  pallor. NEUROLOGIC: Alert and oriented to person, place, and time. Normal reflexes, muscle tone coordination. No cranial nerve deficit noted. PSYCHIATRIC: Normal mood and affect. Normal behavior. Normal judgment and thought content. CARDIOVASCULAR: Normal heart rate noted, regular rhythm RESPIRATORY: Effort normal, no problems with respiration noted. ABDOMEN: Soft, normal bowel sounds, wound appears with pink tissue (dressing not removed entirely), ostomy with serous drainage, pink and moist, abdomen appropriately tender, no rebound or guarding.  PELVIC: deferred MUSCULOSKELETAL: Normal range of motion. No tenderness.  No cyanosis, clubbing, or edema.    UOP: voiding spontaneously JP: 10 mL/overnight  Labs  Recent Labs  Lab 03/11/19 0430 03/11/19 1614 03/12/19 0258  WBC 8.3 9.7 8.0  HGB 8.6* 9.0* 8.4*  HCT 25.1* 26.3* 24.3*  PLT 157 167 164     Assessment & Plan:   Patient is 48 y.o. [id number] POD#3s/p total laparoscopic hysterectomy, bilateral salpingectomy converted to open, end descending colostomy, proctoscopy, cystoscopy. She is improved this am, pain is under better control. Ambulating without issue and passing flatus from ostomy. H/H stable, potassium improved. Ostomy with serous output, will review with gen surg about when to advance diet.   *GYN:s/p vaginal packing, no further repair at this time *Drain/Wound: JP per general surgery, wound/ostomy team following *Pain:oxycodone, toradol *FEN/GI:full clears, will advance per gen surg, protonix, Kdur *KWI:OXBDZHG *Abx: zosyn per pharmacy (for 5 day course) *Dispo:inpatient  Greatly appreciate General Surgery care.   Feliz Beam, M.D.  Attending Center for [employer] Fish farm manager) [phone]

## 2019-03-12 NOTE — Progress Notes (Signed)
Patient ID: Tasha Payne, female   DOB: [birth year], 48 y.o.   MRN: [id number] [facility] Progress Note:   3 Days Post-Op  Subjective: Mental status is clear;  Sleeping and awakened to examine Objective: Vital signs in last 24 hours: Temp:  [98.3 F (36.8 C)-99.3 F (37.4 C)] 98.3 F (36.8 C) (07/04 0455) Pulse Rate:  [88-104] 88 (07/04 0455) Resp:  [18] 18 (07/04 0455) BP: (130-141)/(72-78) 130/72 (07/04 0455) SpO2:  [99 %-100 %] 100 % (07/04 0455)  Intake/Output from previous day: 07/03 0701 - 07/04 0700 In: 3149 [P.O.:1440; I.V.:1607.4; IV Piggyback:101.7] Out: 65 [Drains:65] Intake/Output this shift: No intake/output data recorded.  Physical Exam: Work of breathing is normal;  Ostomy is pink with serous drainage  Lab Results:  Results for orders placed or performed during the hospital encounter of 03/09/19 (from the past 48 hour(s))  Basic metabolic panel     Status: Abnormal   Collection Time: 03/11/19  4:30 AM  Result Value Ref Range   Sodium 138 135 - 145 mmol/L   Potassium 3.0 (L) 3.5 - 5.1 mmol/L   Chloride 105 98 - 111 mmol/L   CO2 25 22 - 32 mmol/L   Glucose, Bld 129 (H) 70 - 99 mg/dL   BUN <5 (L) 6 - 20 mg/dL   Creatinine, Ser 0.75 0.44 - 1.00 mg/dL   Calcium 8.4 (L) 8.9 - 10.3 mg/dL   GFR calc non Af Amer >60 >60 mL/min   GFR calc Af Amer >60 >60 mL/min   Anion gap 8 5 - 15    Comment: Performed at [facility], [place] [address], [place], [place] [address]  CBC     Status: Abnormal   Collection Time: 03/11/19  4:30 AM  Result Value Ref Range   WBC 8.3 4.0 - 10.5 K/uL   RBC 2.99 (L) 3.87 - 5.11 MIL/uL   Hemoglobin 8.6 (L) 12.0 - 15.0 g/dL   HCT 25.1 (L) 36.0 - 46.0 %   MCV 83.9 80.0 - 100.0 fL   MCH 28.8 26.0 - 34.0 pg   MCHC 34.3 30.0 - 36.0 g/dL   RDW 12.9 11.5 - 15.5 %   Platelets 157 150 - 400 K/uL   nRBC 0.0 0.0 - 0.2 %    Comment: Performed at [facility], [place] [address], [place], [place] [address]  CBC     Status:  Abnormal   Collection Time: 03/11/19  4:14 PM  Result Value Ref Range   WBC 9.7 4.0 - 10.5 K/uL   RBC 3.12 (L) 3.87 - 5.11 MIL/uL   Hemoglobin 9.0 (L) 12.0 - 15.0 g/dL   HCT 26.3 (L) 36.0 - 46.0 %   MCV 84.3 80.0 - 100.0 fL   MCH 28.8 26.0 - 34.0 pg   MCHC 34.2 30.0 - 36.0 g/dL   RDW 12.9 11.5 - 15.5 %   Platelets 167 150 - 400 K/uL   nRBC 0.0 0.0 - 0.2 %    Comment: Performed at [facility], [place] [address] [place], [place] [address]  CBC     Status: Abnormal   Collection Time: 03/12/19  2:58 AM  Result Value Ref Range   WBC 8.0 4.0 - 10.5 K/uL   RBC 2.87 (L) 3.87 - 5.11 MIL/uL   Hemoglobin 8.4 (L) 12.0 - 15.0 g/dL   HCT 24.3 (L) 36.0 - 46.0 %   MCV 84.7 80.0 - 100.0 fL   MCH 29.3 26.0 - 34.0  pg   MCHC 34.6 30.0 - 36.0 g/dL   RDW 12.7 11.5 - 15.5 %   Platelets 164 150 - 400 K/uL   nRBC 0.0 0.0 - 0.2 %    Comment: Performed at [facility], [place] [address], [place], [place] [address]  Basic metabolic panel     Status: Abnormal   Collection Time: 03/12/19  2:58 AM  Result Value Ref Range   Sodium 139 135 - 145 mmol/L   Potassium 3.2 (L) 3.5 - 5.1 mmol/L   Chloride 108 98 - 111 mmol/L   CO2 24 22 - 32 mmol/L   Glucose, Bld 117 (H) 70 - 99 mg/dL   BUN <5 (L) 6 - 20 mg/dL   Creatinine, Ser 0.72 0.44 - 1.00 mg/dL   Calcium 8.5 (L) 8.9 - 10.3 mg/dL   GFR calc non Af Amer >60 >60 mL/min   GFR calc Af Amer >60 >60 mL/min   Anion gap 7 5 - 15    Comment: Performed at [facility], [place] [address], [place], [place] [address]    Radiology/Results: No results found.  Anti-infectives: Anti-infectives (From admission, onward)   Start     Dose/Rate Route Frequency Ordered Stop   03/09/19 2100  piperacillin-tazobactam (ZOSYN) IVPB 3.375 g     3.375 g 12.5 mL/hr over 240 Minutes Intravenous Every 8 hours 03/09/19 2048     03/09/19 1600  cefOXitin (MEFOXIN) 2 g in sodium chloride 0.9 % 100 mL IVPB     2 g 200 mL/hr over 30 Minutes Intravenous  Once 03/09/19 1555  03/09/19 1641   03/09/19 1045  ceFAZolin (ANCEF) IVPB 2g/100 mL premix     2 g 200 mL/hr over 30 Minutes Intravenous On call to O.R. 03/09/19 1043 03/09/19 1309      Assessment/Plan: Problem List: Patient Active Problem List   Diagnosis Date Noted  . Surgical complication 56/31/4970  . Abnormal uterine bleeding (AUB) 03/09/2019  . S/P hysterectomy 03/09/2019  . S/P laparoscopic hysterectomy 03/09/2019  . Iron deficiency anemia due to chronic blood loss 01/19/2018  . Symptomatic anemia 01/18/2018  . Menorrhagia 01/18/2018  . Uterine fibroid 01/18/2018    Post Hartman procedure for rectal injury 3 Days Post-Op    LOS: 3 days   Matt B. Hassell Done, MD, [facility] [phone] beeper [phone]  03/12/2019 9:20 AM

## 2019-03-12 NOTE — Progress Notes (Signed)
Patient ID: Tasha Payne, female   DOB: 1971/01/13, 48 y.o.   MRN: [id number] Will advance to soft diet, jp can likely come out prior to discharge Discussed eventual referrals and reversal as well I will see back in next couple weeks

## 2019-03-13 LAB — BASIC METABOLIC PANEL
Anion gap: 10 (ref 5–15)
BUN: <5 mg/dL — ABNORMAL LOW (ref 6–20)
CO2: 24 mmol/L (ref 22–32)
Calcium: 8.9 mg/dL (ref 8.9–10.3)
Chloride: 104 mmol/L (ref 98–111)
Creatinine, Ser: 0.85 mg/dL (ref 0.44–1.00)
GFR calc Af Amer: >60 mL/min (ref >60)
GFR calc non Af Amer: >60 mL/min (ref >60)
Glucose, Bld: 155 mg/dL — ABNORMAL HIGH (ref 70–99)
Potassium: 3.3 mmol/L — ABNORMAL LOW (ref 3.5–5.1)
Sodium: 138 mmol/L (ref 135–145)

## 2019-03-13 MED ORDER — IBUPROFEN 600 MG PO TABS
600.0000 mg | ORAL_TABLET | Freq: Four times a day (QID) | ORAL | Status: DC | PRN
  Administered 2019-03-14 (×3): 600 mg via ORAL
  Filled 2019-03-13 (×3): qty 1

## 2019-03-13 NOTE — Progress Notes (Signed)
Colostomy bag changed as requested by pt. Pt may need time to adjust to colostomy presence

## 2019-03-13 NOTE — Progress Notes (Signed)
    Gynecology Progress Note  Admission Date: 03/09/2019 Current Date: 03/13/2019 10:05 AM  Tasha Payne is a 48 y.o. [id number] HD#5admitted fortotal laparoscopic hysterectomy, bilateral salpingectomy converted to open, end descending colostomy, proctoscopy, cystoscopy.   History complicated by: Patient Active Problem List   Diagnosis Date Noted  . Surgical complication 14/97/0263  . Abnormal uterine bleeding (AUB) 03/09/2019  . S/P hysterectomy 03/09/2019  . S/P laparoscopic hysterectomy 03/09/2019  . Iron deficiency anemia due to chronic blood loss 01/19/2018  . Symptomatic anemia 01/18/2018  . Menorrhagia 01/18/2018  . Uterine fibroid 01/18/2018    Subjective:  Patient feeling okay this am. Reports pain is under better control, overall less. She is ambulating without difficulty, occasional light-headedness. Voiding without issue. Tolerating soft diet without nausea/vomiting. Passing flatus through ostomy, doesn't think she has had any output yet.   Objective:   Vitals:   03/12/19 0455 03/12/19 1345 03/12/19 2029 03/13/19 0416  BP: 130/72 134/83 132/82 (!) 149/77  Pulse: 88 80 81 89  Resp: 18 18 18 18   Temp: 98.3 F (36.8 C) 98.5 F (36.9 C) 99 F (37.2 C) 99.5 F (37.5 C)  TempSrc: Oral Oral Oral Oral  SpO2: 100% 100% 100% 100%  Weight:      Height:        Physical exam: BP (!) 149/77 (BP Location: Left Arm)   Pulse 89   Temp 99.5 F (37.5 C) (Oral)   Resp 18   Ht 5\' 4"  (1.626 m)   Wt 73.6 kg   LMP 02/02/2019 (Approximate)   SpO2 100%   BMI 27.85 kg/m  CONSTITUTIONAL: Well-developed, well-nourished female in no acute distress.  SKIN: Skin is warm and dry. No rash noted. Not diaphoretic. No erythema. No pallor. NEUROLOGIC: Alert and oriented to person, place, and time. Normal reflexes, muscle tone coordination. No cranial nerve deficit noted. PSYCHIATRIC: Normal mood and affect. Normal behavior. Normal judgment and thought content. CARDIOVASCULAR: Normal  heart rate noted, regular rhythm RESPIRATORY: Clear to auscultation bilaterally. Effort and breath sounds normal, no problems with respiration noted. ABDOMEN: Soft, normal bowel sounds, no distention noted. Midline vertical incision with wet to dry dressing, appears pink (dressing not removed), ostomy with serosanguinous output, beefy red, appropriately tender, no rebound or guarding.  PELVIC: deferred MUSCULOSKELETAL: Normal range of motion. No tenderness.  No cyanosis, clubbing, or edema.    UOP: voiding spontaneously JP: 10 mL overnight  Labs  Recent Labs  Lab 03/11/19 0430 03/11/19 1614 03/12/19 0258  WBC 8.3 9.7 8.0  HGB 8.6* 9.0* 8.4*  HCT 25.1* 26.3* 24.3*  PLT 157 167 164     Assessment & Plan:   Patient is 48 y.o. [id number] POD#4s/p total laparoscopic hysterectomy, bilateral salpingectomy converted to open, end descending colostomy, proctoscopy, cystoscopy. She is doing better this am, reports pain under better control. Now on soft diet without any nausea/vomiting although has not had ostomy output yet. Per general surgery, JP drain to be removed today. Will plan for discharge home tomorrow.   *GYN:s/p vaginal packing, no further repair at this time *Drain/Wound:JP for dc today per general surgery, wound/ostomy team following *Pain:oxycodone, toradol *FEN/GI:soft diet, protonix, Kdur *YDX:AJOINOM *Abx: zosyn per pharmacy(for 5 day course) *Dispo:inpatient, home tomororw   Greatly appreciate General Surgery care.   Feliz Beam, M.D. Attending Center for [employer] Fish farm manager) [phone]

## 2019-03-13 NOTE — Progress Notes (Signed)
Colostomy bag emptied twice this pm. Pt not comfortable looking at pouch and stoma.

## 2019-03-13 NOTE — Progress Notes (Signed)
JP drain removed this morning as ordered

## 2019-03-13 NOTE — Progress Notes (Signed)
Patient ID: Tasha Payne, female   DOB: 08-16-1971, 48 y.o.   MRN: [id number] [facility] Progress Note:   4 Days Post-Op  Subjective: Mental status is clear Objective: Vital signs in last 24 hours: Temp:  [98.5 F (36.9 C)-99.5 F (37.5 C)] 99.5 F (37.5 C) (07/05 0416) Pulse Rate:  [80-89] 89 (07/05 0416) Resp:  [18] 18 (07/05 0416) BP: (132-149)/(77-83) 149/77 (07/05 0416) SpO2:  [100 %] 100 % (07/05 0416)  Intake/Output from previous day: 07/04 0701 - 07/05 0700 In: 924 [P.O.:924] Out: 30 [Drains:30] Intake/Output this shift: No intake/output data recorded.  Physical Exam: Work of breathing is normal;  Eating soft diet;  JP minimal serosanguinous  Lab Results:  Results for orders placed or performed during the hospital encounter of 03/09/19 (from the past 48 hour(s))  CBC     Status: Abnormal   Collection Time: 03/11/19  4:14 PM  Result Value Ref Range   WBC 9.7 4.0 - 10.5 K/uL   RBC 3.12 (L) 3.87 - 5.11 MIL/uL   Hemoglobin 9.0 (L) 12.0 - 15.0 g/dL   HCT 26.3 (L) 36.0 - 46.0 %   MCV 84.3 80.0 - 100.0 fL   MCH 28.8 26.0 - 34.0 pg   MCHC 34.2 30.0 - 36.0 g/dL   RDW 12.9 11.5 - 15.5 %   Platelets 167 150 - 400 K/uL   nRBC 0.0 0.0 - 0.2 %    Comment: Performed at [facility], [place] [address], [place], [place] [address]  CBC     Status: Abnormal   Collection Time: 03/12/19  2:58 AM  Result Value Ref Range   WBC 8.0 4.0 - 10.5 K/uL   RBC 2.87 (L) 3.87 - 5.11 MIL/uL   Hemoglobin 8.4 (L) 12.0 - 15.0 g/dL   HCT 24.3 (L) 36.0 - 46.0 %   MCV 84.7 80.0 - 100.0 fL   MCH 29.3 26.0 - 34.0 pg   MCHC 34.6 30.0 - 36.0 g/dL   RDW 12.7 11.5 - 15.5 %   Platelets 164 150 - 400 K/uL   nRBC 0.0 0.0 - 0.2 %    Comment: Performed at [facility], [place] [address] [place], [place] [address]  Basic metabolic panel     Status: Abnormal   Collection Time: 03/12/19  2:58 AM  Result Value Ref Range   Sodium 139 135 - 145 mmol/L   Potassium 3.2 (L) 3.5 - 5.1 mmol/L    Chloride 108 98 - 111 mmol/L   CO2 24 22 - 32 mmol/L   Glucose, Bld 117 (H) 70 - 99 mg/dL   BUN <5 (L) 6 - 20 mg/dL   Creatinine, Ser 0.72 0.44 - 1.00 mg/dL   Calcium 8.5 (L) 8.9 - 10.3 mg/dL   GFR calc non Af Amer >60 >60 mL/min   GFR calc Af Amer >60 >60 mL/min   Anion gap 7 5 - 15    Comment: Performed at [facility], [place] [address] [place], [place] [address]    Radiology/Results: No results found.  Anti-infectives: Anti-infectives (From admission, onward)   Start     Dose/Rate Route Frequency Ordered Stop   03/09/19 2100  piperacillin-tazobactam (ZOSYN) IVPB 3.375 g     3.375 g 12.5 mL/hr over 240 Minutes Intravenous Every 8 hours 03/09/19 2048     03/09/19 1600  cefOXitin (MEFOXIN) 2 g in sodium chloride 0.9 % 100 mL IVPB     2 g 200 mL/hr over  30 Minutes Intravenous  Once 03/09/19 1555 03/09/19 1641   03/09/19 1045  ceFAZolin (ANCEF) IVPB 2g/100 mL premix     2 g 200 mL/hr over 30 Minutes Intravenous On call to O.R. 03/09/19 1043 03/09/19 1309      Assessment/Plan: Problem List: Patient Active Problem List   Diagnosis Date Noted  . Surgical complication 89/38/1017  . Abnormal uterine bleeding (AUB) 03/09/2019  . S/P hysterectomy 03/09/2019  . S/P laparoscopic hysterectomy 03/09/2019  . Iron deficiency anemia due to chronic blood loss 01/19/2018  . Symptomatic anemia 01/18/2018  . Menorrhagia 01/18/2018  . Uterine fibroid 01/18/2018    She has had ostomy training and feels comfortable with this.  She is tolerating her diet.  Will have JP removed and she could be a candidate for discharge either this afternoon or in the AM.   4 Days Post-Op    LOS: 4 days   Matt B. Hassell Done, MD, [facility] [phone] beeper [phone]  03/13/2019 9:48 AM

## 2019-03-14 DIAGNOSIS — Z933 Colostomy status: Secondary | ICD-10-CM

## 2019-03-14 DIAGNOSIS — N823 Fistula of vagina to large intestine: Secondary | ICD-10-CM | POA: Diagnosis not present

## 2019-03-14 DIAGNOSIS — S3660XA Unspecified injury of rectum, initial encounter: Secondary | ICD-10-CM | POA: Diagnosis not present

## 2019-03-14 MED ORDER — PANTOPRAZOLE SODIUM 40 MG PO TBEC
40.0000 mg | DELAYED_RELEASE_TABLET | Freq: Every day | ORAL | Status: DC
  Administered 2019-03-14: 40 mg via ORAL
  Filled 2019-03-14: qty 1

## 2019-03-14 NOTE — Progress Notes (Signed)
Gynecology Progress Note  Admission Date: 03/09/2019 Current Date: 03/14/2019 1:46 PM  Tasha Payne is a 48 y.o. [id number] HD#6 admitted for total laparoscopic hysterectomy, bilateral salpingectomy converted to open, end descending colostomy, proctoscopy, cystoscopy.  History complicated by: Patient Active Problem List   Diagnosis Date Noted  . Rectal injury 03/14/2019  . Rectovaginal fistula 03/14/2019  . Colostomy in place [facility]) 03/14/2019  . Surgical complication 09/81/1914  . Abnormal uterine bleeding (AUB) 03/09/2019  . S/P hysterectomy 03/09/2019  . S/P laparoscopic hysterectomy 03/09/2019  . Iron deficiency anemia due to chronic blood loss 01/19/2018  . Symptomatic anemia 01/18/2018  . Menorrhagia 01/18/2018  . Uterine fibroid 01/18/2018    Subjective:  Patient reports being depressed today. She is tolerating regular diet without nausea/vomiting. She is passing flatus and stool through ostomy. She is having no vaginal bleeding. She is up and ambulating without light-headedness or dizziness.   Objective:   Vitals:   03/13/19 0416 03/13/19 1400 03/13/19 2106 03/14/19 0556  BP: (!) 149/77 (!) 136/97 131/75 124/72  Pulse: 89 92 81 80  Resp: 18 18 18 18   Temp: 99.5 F (37.5 C) 98.6 F (37 C) 99.3 F (37.4 C) 99 F (37.2 C)  TempSrc: Oral Oral Oral Oral  SpO2: 100% 100% 100% 99%  Weight:      Height:       Physical exam: BP 124/72 (BP Location: Left Arm)   Pulse 80   Temp 99 F (37.2 C) (Oral)   Resp 18   Ht 5\' 4"  (1.626 m)   Wt 73.6 kg   LMP 02/02/2019 (Approximate)   SpO2 99%   BMI 27.85 kg/m  CONSTITUTIONAL: Well-developed, well-nourished female in no acute distress. Flat affect today  HENT:  Normocephalic, atraumatic, External right and left ear normal. Oropharynx is clear and moist EYES: Conjunctivae and EOM are normal. Pupils are equal, round, and reactive to light. No scleral icterus.  NECK: Normal range of motion, supple, no masses.  Normal  thyroid.  SKIN: Skin is warm and dry. No rash noted. Not diaphoretic. No erythema. No pallor. NEUROLOGIC: Alert and oriented to person, place, and time. Normal reflexes, muscle tone coordination. No cranial nerve deficit noted. PSYCHIATRIC: Normal mood and affect. Normal behavior. Normal judgment and thought content. CARDIOVASCULAR: Normal heart rate noted RESPIRATORY: Effort normal, no problems with respiration noted. ABDOMEN: Soft, normal bowel sounds, no distention noted. Ostomy changed, tissue beefy red and moist, incision with wet to dry dressing, appears pink, appropriately tender, no rebound or guarding. 2 staples (1 in LLQ and 1 in suprapubic area) removed without incident PELVIC: deferred MUSCULOSKELETAL: Normal range of motion. No tenderness.  No cyanosis, clubbing, or edema.    UOP: voiding spontaneously S/pp JP drain  Labs  Recent Labs  Lab 03/11/19 0430 03/11/19 1614 03/12/19 0258  WBC 8.3 9.7 8.0  HGB 8.6* 9.0* 8.4*  HCT 25.1* 26.3* 24.3*  PLT 157 167 164    Assessment & Plan:   Patient is 48 y.o. [id number] POD#5 s/p total laparoscopic hysterectomy, bilateral salpingectomy converted to open, end descending colostomy, proctoscopy, cystoscopy. She is doing okay this am, reports she is ambulating slightly better but very depressed and minimally interactive with me. S/p JP drain. Very uncomfortable with ostomy per RN, pain somewhat controlled. Has been cleared by Gen Surg, will plan for discharge tomorrow.    *GYN:s/p vaginal packing, no further repair at this time *Drain/Wound:s/p JP drain, wound/ostomy team following *Pain:oxycodone,  motrin *FEN/GI:regular diet, protonix, Kdur *  FVO:HKGOVPC *Abx: s/p 5 day course zosyn *Dispo:inpatient, discharge tomorrow   K. Arvilla Meres, M.D. Attending [facility] for [employer] [facility] farm manager) [phone]

## 2019-03-14 NOTE — Progress Notes (Signed)
   03/14/19 1600  Clinical Encounter Type  Visited With Patient  Visit Type Follow-up;Psychological support;Social support;Post-op  Spiritual Encounters  Spiritual Needs Emotional;Grief support  Stress Factors  Patient Stress Factors Major life changes;Loss of control;Financial concerns;Health changes   Created art w/ crayons and discussed questions pt has about her body, her future, returning to her job, etc.  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  Misses her family.  Myra Gianotti resident, [phone]

## 2019-03-14 NOTE — Discharge Instructions (Signed)
[facility]      [facility] °[phone] ° °OPEN ABDOMINAL SURGERY: POST OP INSTRUCTIONS ° °Always review your discharge instruction sheet given to you by the facility where your surgery was performed. ° °IF YOU HAVE DISABILITY OR FAMILY LEAVE FORMS, YOU MUST BRING THEM TO THE OFFICE FOR PROCESSING.  PLEASE DO NOT GIVE THEM TO YOUR DOCTOR. ° °1. A prescription for pain medication may be given to you upon discharge.  Take your pain medication as prescribed, if needed.  If narcotic pain medicine is not needed, then you may take acetaminophen (Tylenol) or ibuprofen (Advil) as needed. °2. Take your usually prescribed medications unless otherwise directed. °3. If you need a refill on your pain medication, please contact your pharmacy. They will contact our office to request authorization.  Prescriptions will not be filled after 5pm or on week-ends. °4. You should follow a light diet the first few days after arrival home, such as soup and crackers, pudding, etc.unless your doctor has advised otherwise. A high-fiber, low fat diet can be resumed as tolerated.   Be sure to include lots of fluids daily. Most patients will experience some swelling and bruising on the chest and neck area.  Ice packs will help.  Swelling and bruising can take several days to resolve °5. Most patients will experience some swelling and bruising in the area of the incision. Ice pack will help. Swelling and bruising can take several days to resolve..  °6. It is common to experience some constipation if taking pain medication after surgery.  Increasing fluid intake and taking a stool softener will usually help or prevent this problem from occurring.  A mild laxative (Milk of Magnesia or Miralax) should be taken according to package directions if there are no bowel movements after 48 hours. °7.  You may have steri-strips (small skin tapes) in place directly over the incision.  These strips should be left on the skin for 7-10 days.  If your  surgeon used skin glue on the incision, you may shower in 24 hours.  The glue will flake off over the next 2-3 weeks.  Any sutures or staples will be removed at the office during your follow-up visit. You may find that a light gauze bandage over your incision may keep your staples from being rubbed or pulled. You may shower and replace the bandage daily. °8. ACTIVITIES:  You may resume regular (light) daily activities beginning the next day--such as daily self-care, walking, climbing stairs--gradually increasing activities as tolerated.  You may have sexual intercourse when it is comfortable.  Refrain from any heavy lifting or straining until approved by your doctor. °a. You may drive when you no longer are taking prescription pain medication, you can comfortably wear a seatbelt, and you can safely maneuver your car and apply brakes °b. Return to Work: ___________________________________ °9. You should see your doctor in the office for a follow-up appointment approximately two weeks after your surgery.  Make sure that you call for this appointment within a day or two after you arrive home to insure a convenient appointment time. °OTHER INSTRUCTIONS:  °_____________________________________________________________ °_____________________________________________________________ ° °WHEN TO CALL YOUR DOCTOR: °1. Fever over 101.0 °2. Inability to urinate °3. Nausea and/or vomiting °4. Extreme swelling or bruising °5. Continued bleeding from incision. °6. Increased pain, redness, or drainage from the incision. °7. Difficulty swallowing or breathing °8. Muscle cramping or spasms. °9. Numbness or tingling in hands or feet or around lips. ° °The clinic staff is available to   answer your questions during regular business hours.  Please dont hesitate to call and ask to speak to one of the nurses if you have concerns.  For further questions, please visit [email or web address]    MIDLINE WOUND CARE: - midline  dressing to be changed twice daily - supplies: sterile saline, kerlix, scissors, ABD pads, tape  - remove dressing and all packing carefully, moistening with sterile saline as needed to avoid packing/internal dressing sticking to the wound. - clean edges of skin around the wound with water/gauze, making sure there is no tape debris or leakage left on skin that could cause skin irritation or breakdown. - dampen and clean kerlix with sterile saline and pack wound from wound base to skin level, making sure to take note of any possible areas of wound tracking, tunneling and packing appropriately. Wound can be packed loosely. Trim kerlix to size if a whole kerlix is not required. - cover wound with a dry ABD pad and secure with tape.  - write the date/time on the dry dressing/tape to better track when the last dressing change occurred. - apply any skin protectant/powder recommended by clinician to protect skin/skin folds. - change dressing as needed if leakage occurs, wound gets contaminated, or patient requests to shower. - patient may shower daily with wound open and following the shower the wound should be dried and a clean dressing placed.      Hysterectomy Aftercare - You may shower after your surgery. When you shower, let water run over your incisions and pat dry. Do not take a bath or go swimming until you are cleared by your doctor. - A small amount of bleeding or oozing from the incision is normal, if you have a lot or if you are concerned about it, please call the office.  - Do not do any heavy lifting or major activity for at least 4 weeks after your surgery. You should be getting up and moving around the house with daily activities but do not strain yourself. - Do not put ANYTHING in the vagina until cleared by your doctor. This includes tampons, intercourse, douching, fingers, toys, etc. You need time to heal from the surgery. - Some spotting is normal even up to a few weeks after the  surgery. If you have bleeding or foul smelling discharge, please call your doctor.   IF YOU HAVE ANY QUESTIONS OR CONCERNS, PLEASE CALL THE OFFICE.

## 2019-03-14 NOTE — Plan of Care (Signed)
  Problem: Pain Managment: Goal: General experience of comfort will improve Outcome: Progressing   Problem: Nutrition: Goal: Adequate nutrition will be maintained Outcome: Progressing   Problem: Clinical Measurements: Goal: Ability to maintain clinical measurements within normal limits will improve Outcome: Progressing

## 2019-03-14 NOTE — Consult Note (Addendum)
[place] Nurse ostomy follow up Pt requested bedside nurse assist with pouch change earlier today.  It is currently intact with good seal.  She states she has been able to open and close the velcro to empty without assistance.  Reviewed ostomy pouching routines and ordering supplies. Educational materials and 5 sets of barrier rings and flexible pouches left at the bedside for use after discharge.  Recommend home health assistance for pouching assistance after discharge.  Pt denies further questions. Enrolled patient in [facility] Start Discharge program: Yes, previously Julien Girt MSN, [place], [place], [place], [place]

## 2019-03-14 NOTE — TOC Progression Note (Signed)
Transition of Care [facility]) - Progression Note    Patient Details  Name: Tasha Payne MRN: [id number] Date of Birth: 1971-05-28  [facility] CM/SW Contact  Jacalyn Lefevre Edson Snowball, RN Phone Number: 03/14/2019, 4:00 PM  Clinical Narrative:     Patient plans on staying with her mother at discharge. Address is : [address], [place], [place] [address] . Patient's cell phone number is [phone].   Patient's sister will be her support person who will assist her her in changing dressing and managing colostomy.  Patient will call her sister today to see if she can come to the hospital today or tomorrow for education. Bedside aware and will provide teaching.   Patient set up with [facility] start of care date is Thursday March 17, 2019, patient and Dr Rosana Hoes aware.  Expected Discharge Plan: [place] Barriers to Discharge: Continued Medical Work up  Expected Discharge Plan and Services Expected Discharge Plan: [place]   Discharge Planning Services: CM Consult Post Acute Care Choice: [place] arrangements for the past 2 months: Single Family Home                 DME Arranged: N/A         HH Arranged: RN HH Agency: [place] Date [place]: 03/14/19 Time [place]: [id number] Representative spoke with at [place]: [place] Interventions    Readmission Risk Interventions No flowsheet data found.

## 2019-03-14 NOTE — Progress Notes (Signed)
5 Days Post-Op   Subjective/Chief Complaint: Has bowel function, tired, pain controlled, ambulating   Objective: Vital signs in last 24 hours: Temp:  [98.6 F (37 C)-99.3 F (37.4 C)] 99 F (37.2 C) (07/06 0556) Pulse Rate:  [80-92] 80 (07/06 0556) Resp:  [18] 18 (07/06 0556) BP: (124-136)/(72-97) 124/72 (07/06 0556) SpO2:  [99 %-100 %] 99 % (07/06 0556) Last BM Date: 03/13/19  Intake/Output from previous day: 07/05 0701 - 07/06 0700 In: 60 [P.O.:60] Out: -  Intake/Output this shift: No intake/output data recorded.  GI: stoma pink and functional, wound clean, approp tender, has bs  Lab Results:  Recent Labs    03/11/19 1614 03/12/19 0258  WBC 9.7 8.0  HGB 9.0* 8.4*  HCT 26.3* 24.3*  PLT 167 164   BMET Recent Labs    03/12/19 0258 03/13/19 1102  NA 139 138  K 3.2* 3.3*  CL 108 104  CO2 24 24  GLUCOSE 117* 155*  BUN <5* <5*  CREATININE 0.72 0.85  CALCIUM 8.5* 8.9   PT/INR No results for input(s): LABPROT, INR in the last 72 hours. ABG No results for input(s): PHART, HCO3 in the last 72 hours.  Invalid input(s): PCO2, PO2  Studies/Results: No results found.  Anti-infectives: Anti-infectives (From admission, onward)   Start     Dose/Rate Route Frequency Ordered Stop   03/09/19 2100  piperacillin-tazobactam (ZOSYN) IVPB 3.375 g     3.375 g 12.5 mL/hr over 240 Minutes Intravenous Every 8 hours 03/09/19 2048     03/09/19 1600  cefOXitin (MEFOXIN) 2 g in sodium chloride 0.9 % 100 mL IVPB     2 g 200 mL/hr over 30 Minutes Intravenous  Once 03/09/19 1555 03/09/19 1641   03/09/19 1045  ceFAZolin (ANCEF) IVPB 2g/100 mL premix     2 g 200 mL/hr over 30 Minutes Intravenous On call to O.R. 03/09/19 1043 03/09/19 1309      Assessment/Plan: POD#5  End descending colostomy/distal washout/drain, repair IP rectal injury - dressing changes at home -can dc home from my standpoint - I will have my office call for appt next week -discussed with patient that I  discussed with my colorectal partners who recommend referral to see Dr Renelda Mom and Dr Maryland Pink at [facility] but this will be in a couple months FEN: regular diet VTE: SCD's, lovenox ID: Zosyn for 5 days 07/01>>can stop this prior to dc Follow up: Dr. Rosanne Ashing 03/14/2019

## 2019-03-14 NOTE — Progress Notes (Signed)
   03/14/19 1400  Clinical Encounter Type  Visited With Patient  Visit Type Follow-up;Psychological support  Spiritual Encounters  Spiritual Needs Emotional;Grief support  Stress Factors  Patient Stress Factors Loss of control;Health changes;Major life changes   F/u visit w/ pt, first conversation.  Supported pt in grieving how her surgery went wrong.  Compassionate presence, empathetic listening.  Will return Mon pm or Tues am to talk more and do art.  Myra Gianotti resident, [phone]

## 2019-03-15 ENCOUNTER — Encounter: Payer: Self-pay | Admitting: *Deleted

## 2019-03-15 ENCOUNTER — Encounter (HOSPITAL_BASED_OUTPATIENT_CLINIC_OR_DEPARTMENT_OTHER): Payer: Self-pay | Admitting: Obstetrics and Gynecology

## 2019-03-15 MED ORDER — OXYCODONE HCL 5 MG PO TABS: 5.0000 mg | ORAL_TABLET | Freq: Four times a day (QID) | ORAL | 0 refills | Status: AC | PRN

## 2019-03-15 MED ORDER — DOCUSATE SODIUM 100 MG PO CAPS: 100.0000 mg | ORAL_CAPSULE | Freq: Two times a day (BID) | ORAL | 2 refills | Status: AC

## 2019-03-15 MED ORDER — IBUPROFEN 600 MG PO TABS: 600.0000 mg | ORAL_TABLET | Freq: Four times a day (QID) | ORAL | 1 refills | Status: DC | PRN

## 2019-03-15 NOTE — Progress Notes (Signed)
Case management updated that the patient's address for home health is [address] not [address].

## 2019-03-15 NOTE — Progress Notes (Signed)
[facility] Progress Note  6 Days Post-Op   Assessment/Plan POD#5  End descending colostomy/distal washout/drain, repair IP rectal injury - dressing changes at home, HH - can dc home from my standpoint - Dr. Donne Hazel discussed with patient that he discussed with his colorectal partners who recommend referral to see Dr Renelda Mom and Dr Maryland Pink at [facility] but this will be in a couple months  GYK:ZLDJTTS diet VTE: SCD's, lovenox VX:BLTJQ for 5 days 07/01>>can stop this prior to dc Follow up:Dr. Donne Hazel   LOS: 6 days    Subjective: CC: abdominal pain  Pain is mild. No issues overnight. No nausea, vomiting, fever or chills. No new complaints. Discussed follow up.   Objective: Vital signs in last 24 hours: Temp:  [98.3 F (36.8 C)-98.4 F (36.9 C)] 98.3 F (36.8 C) (07/07 0425) Pulse Rate:  [87-98] 98 (07/07 0425) Resp:  [17] 17 (07/07 0425) BP: (123-129)/(73-84) 129/84 (07/07 0425) SpO2:  [98 %-100 %] 98 % (07/07 0425) Last BM Date: 03/14/19  Intake/Output from previous day: No intake/output data recorded. Intake/Output this shift: No intake/output data recorded.  PE: Gen:  Alert, NAD, pleasant, cooperative  Pulm:  Rate and effort normal Abd: Soft, ND, +BS, midline is packed, stoma is pink, + stool in bag, mild generalized abdominal TTP without guarding, no peritonitis Skin: no rashes noted, warm and dry   Anti-infectives: Anti-infectives (From admission, onward)   Start     Dose/Rate Route Frequency Ordered Stop   03/09/19 2100  piperacillin-tazobactam (ZOSYN) IVPB 3.375 g     3.375 g 12.5 mL/hr over 240 Minutes Intravenous Every 8 hours 03/09/19 2048 03/14/19 1800   03/09/19 1600  cefOXitin (MEFOXIN) 2 g in sodium chloride 0.9 % 100 mL IVPB     2 g 200 mL/hr over 30 Minutes Intravenous  Once 03/09/19 1555 03/09/19 1641   03/09/19 1045  ceFAZolin (ANCEF) IVPB 2g/100 mL premix     2 g 200 mL/hr over 30 Minutes Intravenous  On call to O.R. 03/09/19 1043 03/09/19 1309      Lab Results:  No results for input(s): WBC, HGB, HCT, PLT in the last 72 hours. BMET Recent Labs    03/13/19 1102  NA 138  K 3.3*  CL 104  CO2 24  GLUCOSE 155*  BUN <5*  CREATININE 0.85  CALCIUM 8.9   PT/INR No results for input(s): LABPROT, INR in the last 72 hours. CMP     Component Value Date/Time   NA 138 03/13/2019 1102   K 3.3 (L) 03/13/2019 1102   CL 104 03/13/2019 1102   CO2 24 03/13/2019 1102   GLUCOSE 155 (H) 03/13/2019 1102   BUN <5 (L) 03/13/2019 1102   CREATININE 0.85 03/13/2019 1102   CALCIUM 8.9 03/13/2019 1102   GFRNONAA >60 03/13/2019 1102   GFRAA >60 03/13/2019 1102   Lipase  No results found for: LIPASE  Studies/Results: No results found.    Kalman Drape , [facility] 03/15/2019, 9:38 AM  Pager: [phone] Mon-Wed, Friday 7:00am-4:30pm Thurs 7am-11:30am  Consults: [phone]

## 2019-03-15 NOTE — Progress Notes (Signed)
   03/15/19 1300  Clinical Encounter Type  Visited With Patient not available  Visit Type Follow-up   Attempted f/u, but pt appeared to be napped.  Myra Gianotti resident, [phone]

## 2019-03-15 NOTE — Consult Note (Addendum)
[place] Nurse ostomy follow up: Returned to visit patient; bedside nurse yesterday afternoon stated patient had further intimate questions which she was reluctant to discuss.  Informational materials provided to patient regarding fabric pouch covers, ostomate undergarmets, closed end pouches were demonstrated and use discussed, reviewed sex for ostomates and answered patient questions.  She is tearful and somewhat depressed.  Encouraged her to join an ostomy support group; information provided for group in [employer] or [employer].  Julien Girt MSN, RN, [place], [place], [place]

## 2019-03-15 NOTE — Progress Notes (Signed)
    Gynecology Progress Note  Admission Date: 03/09/2019 Current Date: 03/15/2019 1:11 PM  Tasha Payne is a 48 y.o. [id number] HD#7 admitted for total laparoscopic hysterectomy, bilateral salpingectomy converted to open, end descending colostomy, proctoscopy, cystoscopy.   History complicated by: Tasha Payne Active Problem List   Diagnosis Date Noted  . Rectal injury 03/14/2019  . Rectovaginal fistula 03/14/2019  . Colostomy in place [facility]) 03/14/2019  . Surgical complication [id number]  . Abnormal uterine bleeding (AUB) 03/09/2019  . S/P hysterectomy 03/09/2019  . S/P laparoscopic hysterectomy 03/09/2019  . Iron deficiency anemia due to chronic blood loss 01/19/2018  . Symptomatic anemia 01/18/2018  . Menorrhagia 01/18/2018  . Uterine fibroid 01/18/2018   Subjective:  Tasha Payne feeling okay this am. Pain somewhat improved. She is eating/drinking without nausea/vomiting. Reports small gush of blood last night vaginally, but it stopped spontaneously and none since. Voiding without issue. She is ambulating without light-headedness or dizziness.   Objective:   Vitals:   03/14/19 0556 03/14/19 1931 03/15/19 0425 03/15/19 1217  BP: 124/72 123/73 129/84 129/77  Pulse: 80 87 98 96  Resp: 18 17 17 14   Temp: 99 F (37.2 C) 98.4 F (36.9 C) 98.3 F (36.8 C) 98.8 F (37.1 C)  TempSrc: Oral Oral Oral Oral  SpO2: 99% 100% 98% 100%  Weight:      Height:       Physical exam: BP 129/77 (BP Location: Left Arm)   Pulse 96   Temp 98.8 F (37.1 C) (Oral)   Resp 14   Ht 5\' 4"  (1.626 m)   Wt 73.6 kg   LMP 02/02/2019 (Approximate)   SpO2 100%   BMI 27.85 kg/m  CONSTITUTIONAL: Well-developed, well-nourished female in no acute distress.  HENT:  Normocephalic, atraumatic, External right and left ear normal. Oropharynx is clear and moist EYES: Conjunctivae and EOM are normal. Pupils are equal, round, and reactive to light. No scleral icterus.  NECK: Normal range of motion, supple, no masses.   Normal thyroid.  SKIN: Skin is warm and dry. No rash noted. Not diaphoretic. No erythema. No pallor. NEUROLOGIC: Alert and oriented to person, place, and time. Normal reflexes, muscle tone coordination. No cranial nerve deficit noted. PSYCHIATRIC: Normal mood and affect. Normal behavior. Normal judgment and thought content. CARDIOVASCULAR: Normal heart rate noted RESPIRATORY: Effort normal, no problems with respiration noted. ABDOMEN: soft,  No distention noted, appropriately tender with no guarding/rebound, vertical midline incision packed, packing not removed but edges appear pink, ostomy beefy red and moist with output PELVIC: deferred MUSCULOSKELETAL: Normal range of motion. No tenderness.  No cyanosis, clubbing, or edema.    UOP: voiding spontaneously  Labs  Recent Labs  Lab 03/11/19 0430 03/11/19 1614 03/12/19 0258  WBC 8.3 9.7 8.0  HGB 8.6* 9.0* 8.4*  HCT 25.1* 26.3* 24.3*  PLT 157 167 164     Assessment & Plan:   Tasha Payne is 48 y.o. [id number] POD#6 s/p total laparoscopic hysterectomy, bilateral salpingectomy converted to open, end descending colostomy, proctoscopy, cystoscopy. Pain somewhat improved this am. Reports she is eating, voiding without issue. Ostomy with output. Vital signs stable. Cleared by general surgery and home health care has set up. Discharged to home.    Home with home health care for wound/ostomy care To call for f/u with General Surgery [facility] to call and set her up with f/u appointment Reviewed instructions for discharge  K. Arvilla Meres, M.D. Attending Center for [employer] manager)

## 2019-03-15 NOTE — Progress Notes (Signed)
Pt premedicated with 5mg  oxycodone in preparation for dressing change. No complications noted to incision. Stoma emptied

## 2019-03-15 NOTE — TOC Transition Note (Signed)
Transition of Care [facility]) - CM/SW Discharge Note   Patient Details  Name: Tasha Payne MRN: [id number] Date of Birth: 1971/08/31  Transition of Care [facility]) CM/SW Contact:  Weston Anna, LCSW Phone Number: 03/15/2019, 9:45 AM   Clinical Narrative:     Patient set to discharge home today with Donzetta Sprung services will not start until 7/9 patient and family aware. Georgina Snell with [facility] notified of discharge.   Final next level of care: [place] Barriers to Discharge: Continued Medical Work up   Patient Goals and CMS Choice Patient states their goals for this hospitalization and ongoing recovery are:: to go home CMS Medicare.gov Compare Post Acute Care list provided to:: Patient Choice offered to / list presented to : Patient  Discharge Placement                       Discharge Plan and Services   Discharge Planning Services: CM Consult Post Acute Care Choice: Home Health          DME Arranged: N/A         HH Arranged: RN [place]: [place] Date [facility] Contacted: 03/14/19 Time [place]: 9323 Representative spoke with at [place]: [place] Interventions     Readmission Risk Interventions No flowsheet data found.

## 2019-03-15 NOTE — Discharge Summary (Signed)
Physician Discharge Summary  Patient ID: Tasha Payne MRN: [id number] DOB/AGE: 02/10/1971 48 y.o.  Admit date: 03/09/2019 Discharge date: 03/15/2019  Admission Diagnoses: total laparoscopic hysterectomy, bilateral salpingectomy  Discharge Diagnoses:  Principal Problem:   S/P hysterectomy Active Problems:   Menorrhagia   Uterine fibroid   Iron deficiency anemia due to chronic blood loss   Surgical complication   Abnormal uterine bleeding (AUB)   S/P laparoscopic hysterectomy   Rectal injury   Rectovaginal fistula   Colostomy in place [facility])   Discharged Condition: fair  Hospital Course: Please see HPI dated 03/09/2019 for full details. Briefly, this is a 48 y.o. G4P3003 female admitted for total laparoscopic hysterectomy, bilateral salpingectomy. Her operative course was complicated by posterior vaginal wall and rectal mucosa injury which was recognized immediately. General surgery was called for consult, who after performing exam, recommended she have an end colostomy via midline vertical incision. This was done without issue. Recto-vaginal fistula/mucosa not repaired with plan for repair later. JP drain also placed. She also had a cystoscopy which showed bilateral ureteral jets. Midline vertical incision with wet to dry dressing. She was admitted to [facility] for post operative management. She continued to progress well and was advanced to soft diet by POD#3 with regular diet by POD#4. JP drain pulled. She had good ostomy output once she commenced eating. She was discharged POD#6 with wound and ostomy home health care arranged.   She will f/u with [facility] [place] and Dr. Donne Hazel next week. She will be referred to Dr. [place] Pink for vaginal repair and Dr. Renelda Mom at [facility] for plan for eventual reversal.   Physical Exam:  BP 129/77 (BP Location: Left Arm)   Pulse 96   Temp 98.8 F (37.1 C) (Oral)   Resp 14   Ht 5\' 4"  (1.626 m)   Wt 162 lb 4.1 oz  (73.6 kg)   LMP 02/02/2019 (Approximate)   SpO2 100%   BMI 27.85 kg/m  See progress note dated from today   Labs: Lab Results  Component Value Date   WBC 8.0 03/12/2019   HGB 8.4 (L) 03/12/2019   HCT 24.3 (L) 03/12/2019   MCV 84.7 03/12/2019   PLT 164 03/12/2019   CMP Latest Ref Rng & Units 03/13/2019  Glucose 70 - 99 mg/dL 155(H)  BUN 6 - 20 mg/dL <5(L)  Creatinine 0.44 - 1.00 mg/dL 0.85  Sodium 135 - 145 mmol/L 138  Potassium 3.5 - 5.1 mmol/L 3.3(L)  Chloride 98 - 111 mmol/L 104  CO2 22 - 32 mmol/L 24  Calcium 8.9 - 10.3 mg/dL 8.9   Disposition: Discharge disposition: 01-Home or Self Care      Discharge Instructions    Call MD for:  difficulty breathing, headache or visual disturbances   Complete by: As directed    Call MD for:  extreme fatigue   Complete by: As directed    Call MD for:  hives   Complete by: As directed    Call MD for:  persistant dizziness or light-headedness   Complete by: As directed    Call MD for:  persistant nausea and vomiting   Complete by: As directed    Call MD for:  redness, tenderness, or signs of infection (pain, swelling, redness, odor or green/yellow discharge around incision site)   Complete by: As directed    Call MD for:  severe uncontrolled pain   Complete by: As directed    Call MD for:  temperature >100.4  Complete by: As directed    Diet - low sodium heart healthy   Complete by: As directed    Discharge wound care:   Complete by: As directed    Wash with warm, soapy water. Pat dry, do not scrub.   Increase activity slowly   Complete by: As directed    May shower / Bathe   Complete by: As directed      An After Visit Summary was printed and given to the patient. Allergies as of 03/15/2019   No Known Allergies     Medication List    STOP taking these medications   ferrous sulfate 325 (65 FE) MG tablet Commonly known as: Iron Supplement   medroxyPROGESTERone 10 MG tablet Commonly known as: PROVERA     TAKE  these medications   docusate sodium 100 MG capsule Commonly known as: COLACE Take 1 capsule (100 mg total) by mouth 2 (two) times daily.   ibuprofen 600 MG tablet Commonly known as: ADVIL Take 1 tablet (600 mg total) by mouth every 6 (six) hours as needed for fever, headache or moderate pain.   oxyCODONE 5 MG immediate release tablet Commonly known as: Oxy IR/ROXICODONE Take 1 tablet (5 mg total) by mouth every 6 (six) hours as needed for up to 10 days for moderate pain.            Discharge Care Instructions  (From admission, onward)         Start     Ordered   03/15/19 0000  Discharge wound care:    Comments: Wash with warm, soapy water. Pat dry, do not scrub.   03/15/19 0959         Follow-up Information    Rolm Bookbinder, MD. Go on 04/04/2019.   Specialty: General Surgery Why: Your appointment is 7/27 at 3pm Please arrive 30 minutes prior to your appointment to check in and fill out paperwork. Bring photo ID and insurance information. Contact information: [address] [place] [address] [place] for [facility] Follow up.   Specialty: Obstetrics and Gynecology Why: The office will call to schedule your follow up appointment. Contact information: [address] Floor, [place] [id number] mc [place] [address] [phone]       Care, [facility] Follow up.   Specialty: [place] Why: Home health , start of care Thursday 03/17/19 Contact information: [place] [address] [phone]           Signed: Sloan Leiter 03/15/2019, 4:27 PM

## 2019-03-16 ENCOUNTER — Encounter: Payer: Self-pay | Admitting: *Deleted

## 2019-03-16 ENCOUNTER — Telehealth: Payer: Self-pay | Admitting: *Deleted

## 2019-03-16 NOTE — Telephone Encounter (Signed)
Attempted to reach Ms. Tasha Payne by phone regarding change to appointment.  Unable to speak with patient.  Left voicemail message.  Explained her appointment has been moved from 03/23/19 with Dr. Rosana Hoes to Monday 03/21/19 at 1:55 pm with Dr. Kennon Rounds.  Explained no visitors are allowed to attend appointments at this time due to covid 19.  Explained patient will need to wear a mask to her appointment.  Encouraged patient to send [place] a My Chart message or call our office at [phone] with any questions.

## 2019-03-17 ENCOUNTER — Telehealth: Payer: Self-pay

## 2019-03-17 NOTE — Telephone Encounter (Signed)
Received a call from [facility] nurse requesting that Dr. Rosana Hoes give orders for the pt receiving wound care for colostomy due to pt not having a primary care provider.

## 2019-03-18 ENCOUNTER — Telehealth: Payer: Self-pay | Admitting: Obstetrics and Gynecology

## 2019-03-18 NOTE — Telephone Encounter (Signed)
Called the patient to inform of upcoming appointment. Left a detailed voicemail of the location, appointment time and date. Also informed the patient if Tasha Payne been in close contact with someone or diagnosed with Covid19 in the previous 14-day period please call to reschedule. If the patient has also experience flu-like symptoms such as sore throat, fever, and/or shortness of breath. Please call our office to reschedule. In addition, please be sure to wear a face mask to the visit and sanitize hands upon entering the office. Also, no children or visitors or allowed due to [place] restrictions. If you have any questions or concerns, please contact our office.

## 2019-03-21 ENCOUNTER — Ambulatory Visit (INDEPENDENT_AMBULATORY_CARE_PROVIDER_SITE_OTHER): Payer: 59 | Admitting: Family Medicine

## 2019-03-21 ENCOUNTER — Other Ambulatory Visit: Payer: Self-pay

## 2019-03-21 ENCOUNTER — Encounter: Payer: Self-pay | Admitting: Family Medicine

## 2019-03-21 VITALS — BP 128/84 | HR 91 | Wt 152.7 lb

## 2019-03-21 DIAGNOSIS — Z09 Encounter for follow-up examination after completed treatment for conditions other than malignant neoplasm: Secondary | ICD-10-CM

## 2019-03-21 NOTE — Telephone Encounter (Signed)
Received second call that they need someone to sign off on the patient's colostomy orders. Spoke with Dr Rosana Hoes who states orders should come from general surgeon which is Dr Donne Hazel. Called and spoke with [facility] nurse and provided his office information. She verbalized understanding & had no questions.

## 2019-03-21 NOTE — Progress Notes (Signed)
   Subjective:    Patient ID: Tasha Payne is a 48 y.o. female presenting with Post-op Follow-up  on 03/21/2019  HPI: Patient arrives today for f/u. She has undergone LAVH complicated by rectal injury and need for colostomy. She is tolerating PO. Moving urine ok. Stool going into bag. Home health has been out. Her sister is helping her as well. HH comes q 4 days and helps to change her dressings.  Review of Systems  Constitutional: Negative for chills and fever.  Respiratory: Negative for shortness of breath.   Cardiovascular: Negative for chest pain.  Gastrointestinal: Negative for nausea and vomiting.  Genitourinary: Negative for vaginal bleeding.      Objective:    BP 128/84   Pulse 91   Wt 152 lb 11.2 oz (69.3 kg)   BMI 26.21 kg/m  Physical Exam Constitutional:      Appearance: Normal appearance.  Cardiovascular:     Rate and Rhythm: Normal rate.  Pulmonary:     Effort: Pulmonary effort is normal.  Abdominal:     General: Abdomen is flat.     Comments: Incision with dressings clear. No erythema noted. Stoma is pink and healthy appearing.  Neurological:     Mental Status: She is alert.         Assessment & Plan:   Problem List Items Addressed This Visit    None    Visit Diagnoses    Postop check    -  Primary   doing as well as expected. Record release signed today. Offered any help that she needs. f/u with gen surg tom.     Come back with any vaginal bleeding or other issues.  Return if symptoms worsen or fail to improve.  Donnamae Jude 03/21/2019 2:52 PM

## 2019-03-22 ENCOUNTER — Telehealth: Payer: Self-pay | Admitting: General Practice

## 2019-03-22 NOTE — Telephone Encounter (Signed)
Patient called and left message on nurse voicemail line stating Dr Rosana Hoes did her surgery back in January. Patient states she is waiting on orders to be signed so she can get her supplies and she isn't sure who is doing that but really needs them.   Per conversation with Dr Rosana Hoes yesterday, supplies/orders will be coming from general surgeon Dr Donne Hazel. Called patient, no answer- left message stating we are trying to reach you to return your phone call. We will send you a mychart message with more information.

## 2019-03-23 ENCOUNTER — Ambulatory Visit: Payer: 59 | Admitting: Obstetrics and Gynecology

## 2019-04-13 ENCOUNTER — Telehealth: Payer: Self-pay | Admitting: Obstetrics & Gynecology

## 2019-04-13 NOTE — Telephone Encounter (Signed)
Received a fax from [facility] call service on 04/13/2019 at 12:07 pm. Tasha Payne had returned a call I made to her a few days ago. When she answered the phone, and I verified who she was. I introduced myself, and where I was calling from. Once I stated where I was calling from she disconnected the call.

## 2019-04-30 ENCOUNTER — Other Ambulatory Visit: Payer: Self-pay | Admitting: Obstetrics and Gynecology

## 2019-04-30 MED ORDER — IBUPROFEN 600 MG PO TABS: 600.0000 mg | ORAL_TABLET | Freq: Four times a day (QID) | ORAL | 1 refills | Status: AC | PRN

## 2019-04-30 NOTE — Progress Notes (Signed)
Refill of ibuprofen sent to pharmacy.

## 2019-05-12 ENCOUNTER — Encounter: Payer: Self-pay | Admitting: *Deleted

## 2019-05-26 ENCOUNTER — Ambulatory Visit: Payer: Self-pay | Attending: General Surgery | Admitting: Physical Therapy

## 2019-05-26 ENCOUNTER — Other Ambulatory Visit: Payer: Self-pay

## 2019-05-26 DIAGNOSIS — M545 Low back pain, unspecified: Secondary | ICD-10-CM

## 2019-05-26 DIAGNOSIS — M6281 Muscle weakness (generalized): Secondary | ICD-10-CM | POA: Insufficient documentation

## 2019-05-26 DIAGNOSIS — M62830 Muscle spasm of back: Secondary | ICD-10-CM | POA: Insufficient documentation

## 2019-05-29 ENCOUNTER — Encounter: Payer: Self-pay | Admitting: Physical Therapy

## 2019-05-29 NOTE — Therapy (Signed)
[facility] [address], [place], [place], [address] Phone: [phone]   Fax:  [phone]  Physical Therapy Evaluation  Patient Details  Name: Tasha Payne MRN: [id number] Date of Birth: [birth year] Referring Provider (PT): Rolm Bookbinder, MD   Encounter Date: 05/26/2019  PT End of Session - 05/29/19 1556    Visit Number  1    Date for PT Re-Evaluation  07/21/19    PT Start Time  1230    PT Stop Time  1311    PT Time Calculation (min)  41 min    Activity Tolerance  Patient tolerated treatment well       Past Medical History:  Diagnosis Date  . Anemia   . Fibroid   . Medical history non-contributory     Past Surgical History:  Procedure Laterality Date  . APPLICATION OF WOUND VAC  03/09/2019   Procedure: APPLICATION OF WOUND VAC;  Surgeon: Rolm Bookbinder, MD;  Location: [place];  Service: General;;  . COLOSTOMY N/A 03/09/2019   Procedure: END DESCENDING COLOSTOMY;  Surgeon: Rolm Bookbinder, MD;  Location: [place];  Service: General;  Laterality: N/A;  . CYSTOSCOPY  03/09/2019   Procedure: CYSTOSCOPY;  Surgeon: Sloan Leiter, MD;  Location: [place];  Service: Gynecology;;  . PROCTOSCOPY N/A 03/09/2019   Procedure: PROCTOSCOPY;  Surgeon: Rolm Bookbinder, MD;  Location: [place];  Service: General;  Laterality: N/A;  . TOTAL LAPAROSCOPIC HYSTERECTOMY WITH SALPINGECTOMY Bilateral 03/09/2019   Procedure: HYSTERECTOMY TOTAL LAPAROSCOPIC WITH BILATERAL SALPINGECTOMY;  Surgeon: Sloan Leiter, MD;  Location: [place];  Service: Gynecology;  Laterality: Bilateral;  . TUBAL LIGATION  1996    There were no vitals filed for this visit.   Subjective Assessment - 05/29/19 1557    Subjective  Pt states she has been having back pain since the surgery.  I can do stuff for about 2-3 hours/day and then can't do anything else.     Limitations  Lifting;Walking;Standing    How long can you stand comfortably?  2 hours at the most    How long can you walk comfortably?  2 hours at the most    Patient Stated Goals  feel better and return to work    Currently in Pain?  Yes    Pain Score  5     Pain Location  Back    Pain Orientation  Lower    Pain Descriptors / Indicators  Tightness;Spasm;Cramping    Pain Type  Acute pain    Pain Onset  More than a month ago    Pain Frequency  Intermittent    Aggravating Factors   moving around more and walking    Pain Relieving Factors  ibuprophen, pain pills sometimes at night when unbearable    Effect of Pain on Daily Activities  unable to work    Multiple Pain Sites  No         [facility] PT Assessment - 05/29/19 0001      Assessment   Medical Diagnosis  M54.9 (ICD-10-CM) - Dorsalgia, unspecified    Referring Provider (PT)  Rolm Bookbinder, MD    Onset Date/Surgical Date  03/09/19    Prior Therapy  No      Precautions   Precaution Comments  colostomy of descending colon      Balance Screen   Has the patient fallen in the past 6 months  No  [place] residence      Prior Function   Level of Independence  Independent    Vocation  On disability      Cognition   Overall Cognitive Status  Within Functional Limits for tasks assessed      Posture/Postural Control   Posture/Postural Control  Postural limitations    Postural Limitations  Increased lumbar lordosis;Rounded Shoulders      ROM / Strength   AROM / PROM / Strength  Strength      Strength   Overall Strength Comments  core moderately weak; LE grossly 4+/5      Flexibility   Soft Tissue Assessment /Muscle Length  yes    Hamstrings  75% limited      Palpation   Palpation comment  lumbar paraspinals very tight and TTP      Ambulation/Gait   Gait Pattern  Decreased stride length                Objective measurements completed on examination: See above  findings.    Pelvic Floor Special Questions - 05/29/19 0001    Prior Pregnancies  Yes    Number of Pregnancies  3    Number of Vaginal Deliveries  3    Any difficulty with labor and deliveries  --   tearing with stitches   Currently Sexually Active  Yes   not current because of pain   Urinary Leakage  Yes    How often  all throughout the day    Pad use  2/day    Urinary urgency  Yes    Urinary frequency  about every 30 min; nocturia 10x    Fecal incontinence  No   colostomy   Fluid intake  a lot    Exam Type  Deferred       [facility] Adult PT Treatment/Exercise - 05/29/19 0001      Self-Care   Self-Care  Other Self-Care Comments    Other Self-Care Comments   educated and performed initial HEP             PT Education - 05/29/19 1557    Education Details  Access Code: [facility]    Person(s) Educated  Patient    Methods  Demonstration;Verbal cues;Handout;Explanation    Comprehension  Verbalized understanding;Returned demonstration       PT Short Term Goals - 05/29/19 1607      PT SHORT TERM GOAL #1   Title  pt will be ind with initial HEP    Time  4    Period  Weeks    Status  New    [place] Date  06/23/19      PT SHORT TERM GOAL #2   Title  pt will notice she can perform daily tasks with 30% less discomfort    Time  4    Period  Weeks    Status  New    Target Date  06/23/19        PT Long Term Goals - 05/29/19 1559      PT LONG TERM GOAL #1   Title  pt will report at least 60% less pain    Time  8    Period  Weeks    Status  New    [place] Date  07/21/19      PT LONG TERM GOAL #2   Title  Pt will be able to stand and walk for at least 6 hours in  a day in order to be able to work on endurance for returning to work    Time  8    Period  Weeks    Status  New    [place] Date  07/21/19      PT LONG TERM GOAL #3   Title  pt will be ind with advanced HEP    Time  8    Period  Weeks    Status  New    Target Date  07/21/19      PT LONG TERM GOAL #4    Title  Pt will be able to sit up in bed with at least 80% less pain and greater ease due to improved core strength    Time  8    Period  Weeks    Status  New    Target Date  07/21/19             Plan - 05/29/19 1656    Clinical Impression Statement  Pt presents to skilled PT due to back pain since hysterectomy in july.  Pt had complications due to endometriosis and currently has colostomy.  She has core and pelvic floor weakness.  She has spasms in her low back and posture abnormalities as mentioned.  Pt has decreased endurance for her normal activities because of spasms and strength imbalances.  Pt will benefit from skilled PT to address impairments metioned above and work towards regaining function to return to work.    Personal Factors and Comorbidities  Comorbidity 2    Comorbidities  hysterectomy and colostomy 03/09/19    Examination-Activity Limitations  Stand;Locomotion Level    Examination-Participation Restrictions  Community Activity    Stability/Clinical Decision Making  Stable/Uncomplicated    Clinical Decision Making  Low    Rehab Potential  Good    PT Frequency  2x / week    PT Duration  8 weeks    PT Treatment/Interventions  ADLs/Self Care Home Management;Biofeedback;Cryotherapy;Electrical Stimulation;Moist Heat;Therapeutic exercise;Therapeutic activities;Neuromuscular re-education;Manual techniques;Scar mobilization;Passive range of motion;Taping;Dry needling;Patient/family education    PT Next Visit Plan  biofeedback, core strength, STM and dry needling to lumbar paraspinals, TENS ice/heat    PT Home Exercise Plan  Access Code: [email or web address]    Consulted and Agree with Plan of Care  Patient       Patient will benefit from skilled therapeutic intervention in order to improve the following deficits and impairments:  Abnormal gait, Decreased range of motion, Difficulty walking, Increased fascial restricitons, Pain, Decreased activity tolerance, Decreased strength, Postural  dysfunction, Impaired flexibility  Visit Diagnosis: Muscle spasm of back  Acute midline low back pain without sciatica  Muscle weakness (generalized)     Problem List Patient Active Problem List   Diagnosis Date Noted  . Rectal injury 03/14/2019  . Rectovaginal fistula 03/14/2019  . Colostomy in place [facility]) 03/14/2019  . Surgical complication [id number]  . Abnormal uterine bleeding (AUB) 03/09/2019  . S/P hysterectomy 03/09/2019  . S/P laparoscopic hysterectomy 03/09/2019  . Iron deficiency anemia due to chronic blood loss 01/19/2018  . Symptomatic anemia 01/18/2018  . Menorrhagia 01/18/2018  . Uterine fibroid 01/18/2018    Jule Ser, PT 05/29/2019, 5:01 PM  [facility] [address], [place], [place], [address] Phone: [phone]   Fax:  [phone]  Name: Tasha Payne MRN: [id number] Date of Birth: 1971/03/27

## 2019-05-29 NOTE — Patient Instructions (Signed)
Access Code: [facility]  URL: [email or web address]  Date: 05/29/2019  Prepared by: Jari Favre   Exercises  Supine Hamstring Stretch - 3 reps - 1 sets - 30 sec hold - 1x daily - 7x weekly  Supine Lower Trunk Rotation - 10 reps - 1 sets - 5 sec hold - 1x daily - 7x weekly  Supine Single Knee to Chest Stretch - 5 reps - 1 sets - 10 sec hold - 2x daily - 7x weekly  Supine Transversus Abdominis Bracing - Hands on Stomach - 10 reps - 3 sets - 1x daily - 7x weekly

## 2019-05-31 ENCOUNTER — Ambulatory Visit: Payer: Self-pay | Admitting: Physical Therapy

## 2019-05-31 ENCOUNTER — Other Ambulatory Visit: Payer: Self-pay

## 2019-05-31 ENCOUNTER — Encounter: Payer: Self-pay | Admitting: Physical Therapy

## 2019-05-31 DIAGNOSIS — M545 Low back pain, unspecified: Secondary | ICD-10-CM

## 2019-05-31 DIAGNOSIS — M6281 Muscle weakness (generalized): Secondary | ICD-10-CM

## 2019-05-31 DIAGNOSIS — M62830 Muscle spasm of back: Secondary | ICD-10-CM

## 2019-05-31 NOTE — Therapy (Signed)
[facility] [address] [place], [place], [address] Phone: [phone]   Fax:  [phone]  Physical Therapy Treatment  Patient Details  Name: Tasha Payne MRN: [id number] Date of Birth: 23-Oct-1970 Referring Provider (PT): Rolm Bookbinder, MD   Encounter Date: 05/31/2019  PT End of Session - 05/31/19 1057    Visit Number  2    Date for PT Re-Evaluation  07/21/19    PT Start Time  1057    PT Stop Time  1155    PT Time Calculation (min)  58 min    Activity Tolerance  Patient tolerated treatment well    Behavior During Therapy  [facility] for tasks assessed/performed       Past Medical History:  Diagnosis Date  . Anemia   . Fibroid   . Medical history non-contributory     Past Surgical History:  Procedure Laterality Date  . APPLICATION OF WOUND VAC  03/09/2019   Procedure: APPLICATION OF WOUND VAC;  Surgeon: Rolm Bookbinder, MD;  Location: [place];  Service: General;;  . COLOSTOMY N/A 03/09/2019   Procedure: END DESCENDING COLOSTOMY;  Surgeon: Rolm Bookbinder, MD;  Location: [place];  Service: General;  Laterality: N/A;  . CYSTOSCOPY  03/09/2019   Procedure: CYSTOSCOPY;  Surgeon: Sloan Leiter, MD;  Location: [place];  Service: Gynecology;;  . PROCTOSCOPY N/A 03/09/2019   Procedure: PROCTOSCOPY;  Surgeon: Rolm Bookbinder, MD;  Location: [place];  Service: General;  Laterality: N/A;  . TOTAL LAPAROSCOPIC HYSTERECTOMY WITH SALPINGECTOMY Bilateral 03/09/2019   Procedure: HYSTERECTOMY TOTAL LAPAROSCOPIC WITH BILATERAL SALPINGECTOMY;  Surgeon: Sloan Leiter, MD;  Location: [place];  Service: Gynecology;  Laterality: Bilateral;  . TUBAL LIGATION  1996    There were no vitals filed for this visit.  Subjective Assessment - 05/31/19 1157    Subjective  I feel about the same.  I took some medicine.  I was in a lot of pain  yesterday.  I can walk for about 2 hours then I get very tired and can't do anything.    Currently in Pain?  Yes    Pain Score  6     Pain Location  Back    Pain Orientation  Mid;Lower;Medial    Pain Descriptors / Indicators  Tightness;Cramping    Pain Type  Acute pain    Pain Onset  More than a month ago    Multiple Pain Sites  No                       [facility] Adult PT Treatment/Exercise - 05/31/19 0001      Exercises   Exercises  Lumbar      Lumbar Exercises: Stretches   Active Hamstring Stretch  Right;Left;2 reps;20 seconds    Pelvic Tilt  20 reps    Figure 4 Stretch  2 reps;20 seconds      Lumbar Exercises: Supine   Ab Set  10 reps;3 seconds    Clam  20 reps;3 seconds    Clam Limitations  red band    Bent Knee Raise  20 reps    Other Supine Lumbar Exercises  double knee drop outs - 20x      Modalities   Modalities  Moist Heat;Electrical Stimulation      Moist Heat Therapy   Number Minutes Moist Heat  15 Minutes    Moist Heat Location  Lumbar  Spine      Electrical Stimulation   Electrical Stimulation Location  lumbar    Electrical Stimulation Action  IFC    Electrical Stimulation Parameters  to tolerance (10 mA)    Electrical Stimulation Goals  Pain      Manual Therapy   Manual Therapy  Soft tissue mobilization    Soft tissue mobilization  lumbar paraspinals - sacral distraction - left sidelying             PT Education - 05/31/19 1159    Education Details  Access Code: [facility]    Person(s) Educated  Patient    Methods  Explanation;Demonstration;Handout;Verbal cues    Comprehension  Verbalized understanding;Returned demonstration       PT Short Term Goals - 05/29/19 1607      PT SHORT TERM GOAL #1   Title  pt will be ind with initial HEP    Time  4    Period  Weeks    Status  New    Target Date  06/23/19      PT SHORT TERM GOAL #2   Title  pt will notice she can perform daily tasks with 30% less discomfort    Time  4    Period   Weeks    Status  New    Target Date  06/23/19        PT Long Term Goals - 05/29/19 1559      PT LONG TERM GOAL #1   Title  pt will report at least 60% less pain    Time  8    Period  Weeks    Status  New    Target Date  07/21/19      PT LONG TERM GOAL #2   Title  Pt will be able to stand and walk for at least 6 hours in a day in order to be able to work on endurance for returning to work    Time  8    Period  Weeks    Status  New    Target Date  07/21/19      PT LONG TERM GOAL #3   Title  pt will be ind with advanced HEP    Time  8    Period  Weeks    Status  New    Target Date  07/21/19      PT LONG TERM GOAL #4   Title  Pt will be able to sit up in bed with at least 80% less pain and greater ease due to improved core strength    Time  8    Period  Weeks    Status  New    Target Date  07/21/19            Plan - 05/31/19 1159    Clinical Impression Statement  Pt responded well to [facility] and modalities for pain management.  She felt good doing the strengthening exercises as well.  Pt needed cues and education on how to perform exercises.  Pt will benefit from skilled PT to continue with strength andpain management.  Goals not met at this time due to initial treatment since eval    PT Treatment/Interventions  ADLs/Self Care Home Management;Biofeedback;Cryotherapy;Electrical Stimulation;Moist Heat;Therapeutic exercise;Therapeutic activities;Neuromuscular re-education;Manual techniques;Scar mobilization;Passive range of motion;Taping;Dry needling;Patient/family education    PT Next Visit Plan  biofeedback, core strength, STM and dry needling to lumbar paraspinals, TENS ice/heat    PT Home Exercise Plan  Access Code: [facility]  Consulted and Agree with Plan of Care  Patient       Patient will benefit from skilled therapeutic intervention in order to improve the following deficits and impairments:  Abnormal gait, Decreased range of motion, Difficulty walking, Increased  fascial restricitons, Pain, Decreased activity tolerance, Decreased strength, Postural dysfunction, Impaired flexibility  Visit Diagnosis: Muscle spasm of back  Acute midline low back pain without sciatica  Muscle weakness (generalized)     Problem List Patient Active Problem List   Diagnosis Date Noted  . Rectal injury 03/14/2019  . Rectovaginal fistula 03/14/2019  . Colostomy in place [facility]) 03/14/2019  . Surgical complication 94/71/2527  . Abnormal uterine bleeding (AUB) 03/09/2019  . S/P hysterectomy 03/09/2019  . S/P laparoscopic hysterectomy 03/09/2019  . Iron deficiency anemia due to chronic blood loss 01/19/2018  . Symptomatic anemia 01/18/2018  . Menorrhagia 01/18/2018  . Uterine fibroid 01/18/2018    Jule Ser, PT 05/31/2019, 12:07 PM  [place] [facility] [address], [place], [place], [address] Phone: [phone]   Fax:  [phone]  Name: Tasha Payne MRN: [id number] Date of Birth: 09/04/1971

## 2019-05-31 NOTE — Patient Instructions (Signed)
Access Code: [facility]  URL: [email or web address]  Date: 05/31/2019  Prepared by: Jari Favre   Exercises  Supine Hamstring Stretch - 3 reps - 1 sets - 30 sec hold - 1x daily - 7x weekly  Supine Lower Trunk Rotation - 10 reps - 1 sets - 5 sec hold - 1x daily - 7x weekly  Supine Single Knee to Chest Stretch - 5 reps - 1 sets - 10 sec hold - 2x daily - 7x weekly  Supine Transversus Abdominis Bracing - Hands on Stomach - 10 reps - 3 sets - 1x daily - 7x weekly  Supine Transversus Abdominis Bracing with Double Leg Fallout - 10 reps - 2 sets - 1x daily - 7x weekly  Hooklying Small March - 10 reps - 2 sets - 1x daily - 7x weekly  Hooklying clam with kegel - 10 reps - 2 sets - 1x daily - 7x weekly

## 2019-06-02 ENCOUNTER — Other Ambulatory Visit: Payer: Self-pay

## 2019-06-02 ENCOUNTER — Encounter: Payer: Self-pay | Admitting: Physical Therapy

## 2019-06-02 ENCOUNTER — Ambulatory Visit: Payer: Self-pay | Admitting: Physical Therapy

## 2019-06-02 DIAGNOSIS — M545 Low back pain, unspecified: Secondary | ICD-10-CM

## 2019-06-02 DIAGNOSIS — M6281 Muscle weakness (generalized): Secondary | ICD-10-CM

## 2019-06-02 DIAGNOSIS — M62830 Muscle spasm of back: Secondary | ICD-10-CM

## 2019-06-02 NOTE — Therapy (Signed)
[facility] [address] [place], [place], [address] Phone: [phone]   Fax:  [phone]  Physical Therapy Treatment  Patient Details  Name: Tasha Payne MRN: [id number] Date of Birth: 12-22-1970 Referring Provider (PT): Rolm Bookbinder, MD   Encounter Date: 06/02/2019  PT End of Session - 06/02/19 1213    Visit Number  3    Date for PT Re-Evaluation  07/21/19    PT Start Time  1142    PT Stop Time  1228    PT Time Calculation (min)  46 min    Activity Tolerance  Patient tolerated treatment well    Behavior During Therapy  [facility] for tasks assessed/performed       Past Medical History:  Diagnosis Date  . Anemia   . Fibroid   . Medical history non-contributory     Past Surgical History:  Procedure Laterality Date  . APPLICATION OF WOUND VAC  03/09/2019   Procedure: APPLICATION OF WOUND VAC;  Surgeon: Rolm Bookbinder, MD;  Location: [place];  Service: General;;  . COLOSTOMY N/A 03/09/2019   Procedure: END DESCENDING COLOSTOMY;  Surgeon: Rolm Bookbinder, MD;  Location: [place];  Service: General;  Laterality: N/A;  . CYSTOSCOPY  03/09/2019   Procedure: CYSTOSCOPY;  Surgeon: Sloan Leiter, MD;  Location: [place];  Service: Gynecology;;  . PROCTOSCOPY N/A 03/09/2019   Procedure: PROCTOSCOPY;  Surgeon: Rolm Bookbinder, MD;  Location: [place];  Service: General;  Laterality: N/A;  . TOTAL LAPAROSCOPIC HYSTERECTOMY WITH SALPINGECTOMY Bilateral 03/09/2019   Procedure: HYSTERECTOMY TOTAL LAPAROSCOPIC WITH BILATERAL SALPINGECTOMY;  Surgeon: Sloan Leiter, MD;  Location: [place];  Service: Gynecology;  Laterality: Bilateral;  . TUBAL LIGATION  1996    There were no vitals filed for this visit.  Subjective Assessment - 06/02/19 1144    Subjective  Pt states she feels a little better today.  The exercises feel good.  I stayed  in bed all day yesterday.    Limitations  Lifting;Walking;Standing    Currently in Pain?  Yes    Pain Location  Back    Pain Orientation  Lower;Mid    Pain Descriptors / Indicators  Tightness;Cramping    Pain Type  Acute pain    Pain Onset  More than a month ago    Pain Frequency  Intermittent    Multiple Pain Sites  No                       [facility] Adult PT Treatment/Exercise - 06/02/19 0001      Lumbar Exercises: Stretches   Active Hamstring Stretch  Right;Left;2 reps;20 seconds    Gastroc Stretch  3 reps;20 seconds      Lumbar Exercises: Aerobic   Nustep  L1 x 6 min - seat 8; UE 9   moist heat at lumbar     Lumbar Exercises: Supine   Other Supine Lumbar Exercises  towel behind shoulders - UE extension stretch - 10x 5 sec hold      Moist Heat Therapy   Number Minutes Moist Heat  15 Minutes    Moist Heat Location  Lumbar Spine      Electrical Stimulation   Electrical Stimulation Location  lumbar    Electrical Stimulation Action  IFC    Electrical Stimulation Parameters  to tolerance    Electrical Stimulation Goals  Pain  Manual Therapy   Soft tissue mobilization  lumbar sacral distraction - in supine               PT Short Term Goals - 06/02/19 1214      PT SHORT TERM GOAL #1   Title  pt will be ind with initial HEP    Status  Achieved      PT SHORT TERM GOAL #2   Title  pt will notice she can perform daily tasks with 30% less discomfort    Baseline  feeling better today    Status  On-going        PT Long Term Goals - 05/29/19 1559      PT LONG TERM GOAL #1   Title  pt will report at least 60% less pain    Time  8    Period  Weeks    Status  New    Target Date  07/21/19      PT LONG TERM GOAL #2   Title  Pt will be able to stand and walk for at least 6 hours in a day in order to be able to work on endurance for returning to work    Time  8    Period  Weeks    Status  New    Target Date  07/21/19      PT LONG TERM GOAL #3    Title  pt will be ind with advanced HEP    Time  8    Period  Weeks    Status  New    Target Date  07/21/19      PT LONG TERM GOAL #4   Title  Pt will be able to sit up in bed with at least 80% less pain and greater ease due to improved core strength    Time  8    Period  Weeks    Status  New    Target Date  07/21/19            Plan - 06/02/19 1214    Clinical Impression Statement  Pt met initial goal with HEP and is feeling better.  She was educated on getting TENS unit for home because that has helped quite a bit with pain management.  Pt was able to do some more exercises in upright position today.  She still moves very slowly and cautiously due to pain and spasm. She responded well to myofascial stretch to low back with supine sacral pull.    PT Treatment/Interventions  ADLs/Self Care Home Management;Biofeedback;Cryotherapy;Electrical Stimulation;Moist Heat;Therapeutic exercise;Therapeutic activities;Neuromuscular re-education;Manual techniques;Scar mobilization;Passive range of motion;Taping;Dry needling;Patient/family education    PT Next Visit Plan  biofeedback, core strength, STM and dry needling to lumbar paraspinals, TENS heat    PT Home Exercise Plan  Access Code: [email or web address]    Consulted and Agree with Plan of Care  Patient       Patient will benefit from skilled therapeutic intervention in order to improve the following deficits and impairments:  Abnormal gait, Decreased range of motion, Difficulty walking, Increased fascial restricitons, Pain, Decreased activity tolerance, Decreased strength, Postural dysfunction, Impaired flexibility  Visit Diagnosis: Muscle spasm of back  Acute midline low back pain without sciatica  Muscle weakness (generalized)     Problem List Patient Active Problem List   Diagnosis Date Noted  . Rectal injury 03/14/2019  . Rectovaginal fistula 03/14/2019  . Colostomy in place [facility]) 03/14/2019  . Surgical complication 53/64/6803  .  Abnormal  uterine bleeding (AUB) 03/09/2019  . S/P hysterectomy 03/09/2019  . S/P laparoscopic hysterectomy 03/09/2019  . Iron deficiency anemia due to chronic blood loss 01/19/2018  . Symptomatic anemia 01/18/2018  . Menorrhagia 01/18/2018  . Uterine fibroid 01/18/2018    Jule Ser, PT 06/02/2019, 12:22 PM  [place] [facility] [address] [place], [place], [address] Phone: [phone]   Fax:  [phone]  Name: Tasha Payne MRN: [id number] Date of Birth: 14-Mar-1971

## 2019-06-02 NOTE — Patient Instructions (Signed)
   [facility] [address], [address] [place], [place] [address] Phone # [phone] Fax [phone]  

## 2019-06-07 ENCOUNTER — Encounter: Payer: Self-pay | Admitting: Physical Therapy

## 2019-06-07 ENCOUNTER — Other Ambulatory Visit: Payer: Self-pay

## 2019-06-07 ENCOUNTER — Ambulatory Visit: Payer: Self-pay | Admitting: Physical Therapy

## 2019-06-07 DIAGNOSIS — M62830 Muscle spasm of back: Secondary | ICD-10-CM

## 2019-06-07 DIAGNOSIS — M6281 Muscle weakness (generalized): Secondary | ICD-10-CM

## 2019-06-07 DIAGNOSIS — M545 Low back pain, unspecified: Secondary | ICD-10-CM

## 2019-06-07 NOTE — Patient Instructions (Signed)
[email or web address]

## 2019-06-07 NOTE — Therapy (Signed)
[facility] [address] [place], [place], [address] Phone: [phone]   Fax:  [phone]  Physical Therapy Treatment  Patient Details  Name: Tasha Payne MRN: [id number] Date of Birth: 1970-12-05 Referring Provider (PT): Rolm Bookbinder, MD   Encounter Date: 06/07/2019  PT End of Session - 06/07/19 1317    Visit Number  4    Date for PT Re-Evaluation  07/21/19    PT Start Time  1234    PT Stop Time  1315    PT Time Calculation (min)  41 min    Activity Tolerance  Patient tolerated treatment well    Behavior During Therapy  [facility] for tasks assessed/performed       Past Medical History:  Diagnosis Date  . Anemia   . Fibroid   . Medical history non-contributory     Past Surgical History:  Procedure Laterality Date  . APPLICATION OF WOUND VAC  03/09/2019   Procedure: APPLICATION OF WOUND VAC;  Surgeon: Rolm Bookbinder, MD;  Location: [place];  Service: General;;  . COLOSTOMY N/A 03/09/2019   Procedure: END DESCENDING COLOSTOMY;  Surgeon: Rolm Bookbinder, MD;  Location: [place];  Service: General;  Laterality: N/A;  . CYSTOSCOPY  03/09/2019   Procedure: CYSTOSCOPY;  Surgeon: Sloan Leiter, MD;  Location: [place];  Service: Gynecology;;  . PROCTOSCOPY N/A 03/09/2019   Procedure: PROCTOSCOPY;  Surgeon: Rolm Bookbinder, MD;  Location: [place];  Service: General;  Laterality: N/A;  . TOTAL LAPAROSCOPIC HYSTERECTOMY WITH SALPINGECTOMY Bilateral 03/09/2019   Procedure: HYSTERECTOMY TOTAL LAPAROSCOPIC WITH BILATERAL SALPINGECTOMY;  Surgeon: Sloan Leiter, MD;  Location: [place];  Service: Gynecology;  Laterality: Bilateral;  . TUBAL LIGATION  1996    There were no vitals filed for this visit.  Subjective Assessment - 06/07/19 1235    Subjective  Pt states she felt good when she left.  She has been doing nothing last week  but mostly because of feeling depressed.    Currently in Pain?  Yes    Pain Score  5     Pain Location  Back    Pain Orientation  Lower    Pain Descriptors / Indicators  Tightness;Cramping    Pain Type  Acute pain    Pain Onset  More than a month ago    Pain Frequency  Intermittent    Pain Relieving Factors  exercises                       [facility] Adult PT Treatment/Exercise - 06/07/19 0001      Lumbar Exercises: Aerobic   Nustep  L2 x 10 min - seat 8; UE 9   moist heat at lumbar     Lumbar Exercises: Seated   Other Seated Lumbar Exercises  ball squeeze with core and pelvic engaged - 20x 3 sec; horizontal abduction red band, clam red band, diagonals red band - 20x each             PT Education - 06/07/19 1316    Education Details  [email or web address]    Person(s) Educated  Patient    Methods  Explanation;Demonstration;Handout;Verbal cues    Comprehension  Verbalized understanding;Returned demonstration       PT Short Term Goals - 06/02/19 1214      PT SHORT TERM GOAL #1   Title  pt will be ind with initial HEP  Status  Achieved      PT SHORT TERM GOAL #2   Title  pt will notice she can perform daily tasks with 30% less discomfort    Baseline  feeling better today    Status  On-going        PT Long Term Goals - 05/29/19 1559      PT LONG TERM GOAL #1   Title  pt will report at least 60% less pain    Time  8    Period  Weeks    Status  New    Target Date  07/21/19      PT LONG TERM GOAL #2   Title  Pt will be able to stand and walk for at least 6 hours in a day in order to be able to work on endurance for returning to work    Time  8    Period  Weeks    Status  New    Target Date  07/21/19      PT LONG TERM GOAL #3   Title  pt will be ind with advanced HEP    Time  8    Period  Weeks    Status  New    Target Date  07/21/19      PT LONG TERM GOAL #4   Title  Pt will be able to sit up in bed with at least 80% less pain and greater ease due  to improved core strength    Time  8    Period  Weeks    Status  New    Target Date  07/21/19            Plan - 06/07/19 1317    Clinical Impression Statement  Pt did well with progression of exercises.  She had some pain initially when standing after doing the seated exericses but appeared to loosen up after walking several steps.  Pt will continue to benefit from skilled PT to work on core and pelvic floor strength in order to improve abilty to walk and stand functional activities.    PT Treatment/Interventions  ADLs/Self Care Home Management;Biofeedback;Cryotherapy;Electrical Stimulation;Moist Heat;Therapeutic exercise;Therapeutic activities;Neuromuscular re-education;Manual techniques;Scar mobilization;Passive range of motion;Taping;Dry needling;Patient/family education    PT Next Visit Plan  biofeedback, core strength, STM to lumbar paraspinals, TENS heat    PT Home Exercise Plan  Access Code: [email or web address]    Consulted and Agree with Plan of Care  Patient       Patient will benefit from skilled therapeutic intervention in order to improve the following deficits and impairments:  Abnormal gait, Decreased range of motion, Difficulty walking, Increased fascial restricitons, Pain, Decreased activity tolerance, Decreased strength, Postural dysfunction, Impaired flexibility  Visit Diagnosis: Muscle spasm of back  Acute midline low back pain without sciatica  Muscle weakness (generalized)     Problem List Patient Active Problem List   Diagnosis Date Noted  . Rectal injury 03/14/2019  . Rectovaginal fistula 03/14/2019  . Colostomy in place [facility]) 03/14/2019  . Surgical complication [id number]  . Abnormal uterine bleeding (AUB) 03/09/2019  . S/P hysterectomy 03/09/2019  . S/P laparoscopic hysterectomy 03/09/2019  . Iron deficiency anemia due to chronic blood loss 01/19/2018  . Symptomatic anemia 01/18/2018  . Menorrhagia 01/18/2018  . Uterine fibroid 01/18/2018    Jule Ser, PT 06/07/2019, 1:24 PM  [place] [facility] [address], [place], [place], [address] Phone: [phone]   Fax:  [phone]  Name: Tasha Payne MRN: [id number] Date of Birth: 12/18/1970

## 2019-06-09 ENCOUNTER — Ambulatory Visit: Payer: Medicaid Other | Attending: General Surgery | Admitting: Physical Therapy

## 2019-06-09 ENCOUNTER — Other Ambulatory Visit: Payer: Self-pay

## 2019-06-09 DIAGNOSIS — M6281 Muscle weakness (generalized): Secondary | ICD-10-CM | POA: Insufficient documentation

## 2019-06-09 DIAGNOSIS — M545 Low back pain, unspecified: Secondary | ICD-10-CM

## 2019-06-09 DIAGNOSIS — M62830 Muscle spasm of back: Secondary | ICD-10-CM | POA: Insufficient documentation

## 2019-06-09 NOTE — Therapy (Signed)
[facility] [address] [place], [place], [address] Phone: [phone]   Fax:  [phone]  Physical Therapy Treatment  Patient Details  Name: Tasha Payne MRN: [id number] Date of Birth: 1971/04/25 Referring Provider (PT): Rolm Bookbinder, MD   Encounter Date: 06/09/2019  PT End of Session - 06/09/19 1310    Visit Number  5    Date for PT Re-Evaluation  07/21/19    PT Start Time  1225    PT Stop Time  1315    PT Time Calculation (min)  50 min    Activity Tolerance  Patient tolerated treatment well    Behavior During Therapy  [facility] for tasks assessed/performed       Past Medical History:  Diagnosis Date  . Anemia   . Fibroid   . Medical history non-contributory     Past Surgical History:  Procedure Laterality Date  . APPLICATION OF WOUND VAC  03/09/2019   Procedure: APPLICATION OF WOUND VAC;  Surgeon: Rolm Bookbinder, MD;  Location: [place];  Service: General;;  . COLOSTOMY N/A 03/09/2019   Procedure: END DESCENDING COLOSTOMY;  Surgeon: Rolm Bookbinder, MD;  Location: [place];  Service: General;  Laterality: N/A;  . CYSTOSCOPY  03/09/2019   Procedure: CYSTOSCOPY;  Surgeon: Sloan Leiter, MD;  Location: [place];  Service: Gynecology;;  . PROCTOSCOPY N/A 03/09/2019   Procedure: PROCTOSCOPY;  Surgeon: Rolm Bookbinder, MD;  Location: [place];  Service: General;  Laterality: N/A;  . TOTAL LAPAROSCOPIC HYSTERECTOMY WITH SALPINGECTOMY Bilateral 03/09/2019   Procedure: HYSTERECTOMY TOTAL LAPAROSCOPIC WITH BILATERAL SALPINGECTOMY;  Surgeon: Sloan Leiter, MD;  Location: [place];  Service: Gynecology;  Laterality: Bilateral;  . TUBAL LIGATION  1996    There were no vitals filed for this visit.  Subjective Assessment - 06/09/19 1230    Subjective  I am a little sore since last visit when we did more sitting exercises.    Patient Stated Goals  feel better and return to work    Currently in Pain?  Yes    Pain Score  6     Pain Location  Back    Pain Orientation  Lower    Pain Descriptors / Indicators  Cramping;Sore    Pain Type  Surgical pain    Pain Onset  More than a month ago    Pain Frequency  Constant    Multiple Pain Sites  No                       [facility] Adult PT Treatment/Exercise - 06/09/19 0001      Lumbar Exercises: Supine   Ab Set  10 reps    Bent Knee Raise  10 reps    Bridge  10 reps   mini lift   Large Ball Abdominal Isometric Limitations  roll in and out with re ball, UE overhead red ball    Large Ball Oblique Isometric Limitations  side to side red ball      Lumbar Exercises: Sidelying   Clam  15 reps      Electrical Stimulation   Electrical Stimulation Location  lumbar    Electrical Stimulation Action  IFC    Electrical Stimulation Parameters  to tolerance x 10 min    Electrical Stimulation Goals  Pain               PT Short Term Goals -  06/02/19 1214      PT SHORT TERM GOAL #1   Title  pt will be ind with initial HEP    Status  Achieved      PT SHORT TERM GOAL #2   Title  pt will notice she can perform daily tasks with 30% less discomfort    Baseline  feeling better today    Status  On-going        PT Long Term Goals - 05/29/19 1559      PT LONG TERM GOAL #1   Title  pt will report at least 60% less pain    Time  8    Period  Weeks    Status  New    [place] Date  07/21/19      PT LONG TERM GOAL #2   Title  Pt will be able to stand and walk for at least 6 hours in a day in order to be able to work on endurance for returning to work    Time  8    Period  Weeks    Status  New    Target Date  07/21/19      PT LONG TERM GOAL #3   Title  pt will be ind with advanced HEP    Time  8    Period  Weeks    Status  New    Target Date  07/21/19      PT LONG TERM GOAL #4   Title  Pt will be able to sit up in bed with at least 80% less pain  and greater ease due to improved core strength    Time  8    Period  Weeks    Status  New    Target Date  07/21/19            Plan - 06/09/19 1313    Clinical Impression Statement  Pt tolerated exercises today doing more core and pelvic floor strengthening lying down.  Pt was educated in how to perform kegel and added these to some of the core strengthening exercises today.  Pt does not know when she will be able to have iliostomy reversal but is going to benefit from skilled PT to continue to work on core and pelvic floor strength for improved lumbar support.    PT Treatment/Interventions  ADLs/Self Care Home Management;Biofeedback;Cryotherapy;Electrical Stimulation;Moist Heat;Therapeutic exercise;Therapeutic activities;Neuromuscular re-education;Manual techniques;Scar mobilization;Passive range of motion;Taping;Dry needling;Patient/family education    PT Next Visit Plan  biofeedback, core strength, STM to lumbar paraspinals, TENS heat    PT Home Exercise Plan  Access Code: [email or web address]    Consulted and Agree with Plan of Care  Patient       Patient will benefit from skilled therapeutic intervention in order to improve the following deficits and impairments:  Abnormal gait, Decreased range of motion, Difficulty walking, Increased fascial restricitons, Pain, Decreased activity tolerance, Decreased strength, Postural dysfunction, Impaired flexibility  Visit Diagnosis: Muscle spasm of back  Acute midline low back pain without sciatica  Muscle weakness (generalized)     Problem List Patient Active Problem List   Diagnosis Date Noted  . Rectal injury 03/14/2019  . Rectovaginal fistula 03/14/2019  . Colostomy in place [facility]) 03/14/2019  . Surgical complication 123XX123  . Abnormal uterine bleeding (AUB) 03/09/2019  . S/P hysterectomy 03/09/2019  . S/P laparoscopic hysterectomy 03/09/2019  . Iron deficiency anemia due to chronic blood loss 01/19/2018  . Symptomatic anemia  01/18/2018  . Menorrhagia 01/18/2018  .  Uterine fibroid 01/18/2018    Jule Ser, PT 06/09/2019, 5:02 PM  [facility] [address], [place], [place], [address] Phone: [phone]   Fax:  [phone]  Name: DIEGO CHAHAL MRN: [id number] Date of Birth: 09-Jun-1971

## 2019-06-21 ENCOUNTER — Other Ambulatory Visit: Payer: Self-pay

## 2019-06-21 ENCOUNTER — Encounter: Payer: Self-pay | Admitting: Physical Therapy

## 2019-06-21 ENCOUNTER — Ambulatory Visit: Payer: Medicaid Other | Admitting: Physical Therapy

## 2019-06-21 DIAGNOSIS — M545 Low back pain, unspecified: Secondary | ICD-10-CM

## 2019-06-21 DIAGNOSIS — M6281 Muscle weakness (generalized): Secondary | ICD-10-CM

## 2019-06-21 DIAGNOSIS — M62830 Muscle spasm of back: Secondary | ICD-10-CM | POA: Diagnosis not present

## 2019-06-21 NOTE — Patient Instructions (Signed)
Updates to [employer]

## 2019-06-21 NOTE — Therapy (Signed)
[facility] [address], [place], [place], [address] Phone: [phone]   Fax:  [phone]  Physical Therapy Treatment  Patient Details  Name: Tasha Payne MRN: [id number] Date of Birth: Sep 09, 1970 Referring Provider (PT): Rolm Bookbinder, MD   Encounter Date: 06/21/2019  PT End of Session - 06/21/19 1415    Visit Number  6    Date for PT Re-Evaluation  07/21/19    PT Start Time  [id number]    PT Stop Time  1443    PT Time Calculation (min)  41 min    Activity Tolerance  Patient tolerated treatment well    Behavior During Therapy  [facility] for tasks assessed/performed       Past Medical History:  Diagnosis Date  . Anemia   . Fibroid   . Medical history non-contributory     Past Surgical History:  Procedure Laterality Date  . APPLICATION OF WOUND VAC  03/09/2019   Procedure: APPLICATION OF WOUND VAC;  Surgeon: Rolm Bookbinder, MD;  Location: [place];  Service: General;;  . COLOSTOMY N/A 03/09/2019   Procedure: END DESCENDING COLOSTOMY;  Surgeon: Rolm Bookbinder, MD;  Location: [place];  Service: General;  Laterality: N/A;  . CYSTOSCOPY  03/09/2019   Procedure: CYSTOSCOPY;  Surgeon: Sloan Leiter, MD;  Location: [place];  Service: Gynecology;;  . PROCTOSCOPY N/A 03/09/2019   Procedure: PROCTOSCOPY;  Surgeon: Rolm Bookbinder, MD;  Location: [place];  Service: General;  Laterality: N/A;  . TOTAL LAPAROSCOPIC HYSTERECTOMY WITH SALPINGECTOMY Bilateral 03/09/2019   Procedure: HYSTERECTOMY TOTAL LAPAROSCOPIC WITH BILATERAL SALPINGECTOMY;  Surgeon: Sloan Leiter, MD;  Location: [place];  Service: Gynecology;  Laterality: Bilateral;  . TUBAL LIGATION  1996    There were no vitals filed for this visit.  Subjective Assessment - 06/21/19 1418    Subjective  I am a little sore but not bad.    Patient Stated Goals  feel better and  return to work    Currently in Pain?  Yes    Pain Score  4     Pain Location  Back    Pain Orientation  Lower    Pain Descriptors / Indicators  Cramping;Sore    Multiple Pain Sites  No                       [facility] Adult PT Treatment/Exercise - 06/21/19 0001      Neuro Re-ed    Neuro Re-ed Details   rest 2.48mV, quick contract 3x max 45mV; 10 sec 10.21mV , 20 sec hold 9.49mV, resting tone 2.66mV      Lumbar Exercises: Supine   Bent Knee Raise  10 reps;4 seconds   4 sec rest - biofeedback   Other Supine Lumbar Exercises  pelvic floor endurance - 10 sec up the "ramp"; 10 sec hold with toes together    Other Supine Lumbar Exercises  bent knee fall out - 10 x 4 sec hold, 4 sec relax             PT Education - 06/21/19 1448    Education Details  updated medbridge    Person(s) Educated  Patient    Methods  Explanation;Demonstration;Handout;Verbal cues    Comprehension  Verbalized understanding;Returned demonstration       PT Short Term Goals - 06/21/19 1433      PT SHORT TERM GOAL #2   Title  pt will notice she can perform daily tasks with 30% less discomfort    Baseline  no changes    Status  On-going        PT Long Term Goals - 06/21/19 1433      PT LONG TERM GOAL #4   Title  Pt will be able to sit up in bed with at least 80% less pain and greater ease due to improved core strength    Baseline  same    Status  On-going            Plan - 06/21/19 1425    Clinical Impression Statement  Pt did well using biofeedback . She has the most difficulty with endurance of >3 seconds.  Pt did well with the ramp and building up strength slowly, but reached a plateau after about 6 seconds.  Pt will benefit from skilled PT to continue working on muscle coordination and endurance for improved outcomes of reversal surgery    PT Treatment/Interventions  ADLs/Self Care Home Management;Biofeedback;Cryotherapy;Electrical Stimulation;Moist Heat;Therapeutic  exercise;Therapeutic activities;Neuromuscular re-education;Manual techniques;Scar mobilization;Passive range of motion;Taping;Dry needling;Patient/family education    PT Next Visit Plan  biofeedback as needed, core strength, STM to lumbar paraspinals, TENS heat    PT Home Exercise Plan  Access Code: [facility]    Recommended Other Services  intial cert signed    Consulted and Agree with Plan of Care  Patient       Patient will benefit from skilled therapeutic intervention in order to improve the following deficits and impairments:  Abnormal gait, Decreased range of motion, Difficulty walking, Increased fascial restricitons, Pain, Decreased activity tolerance, Decreased strength, Postural dysfunction, Impaired flexibility  Visit Diagnosis: Muscle spasm of back  Acute midline low back pain without sciatica  Muscle weakness (generalized)     Problem List Patient Active Problem List   Diagnosis Date Noted  . Rectal injury 03/14/2019  . Rectovaginal fistula 03/14/2019  . Colostomy in place [facility]) 03/14/2019  . Surgical complication 123XX123  . Abnormal uterine bleeding (AUB) 03/09/2019  . S/P hysterectomy 03/09/2019  . S/P laparoscopic hysterectomy 03/09/2019  . Iron deficiency anemia due to chronic blood loss 01/19/2018  . Symptomatic anemia 01/18/2018  . Menorrhagia 01/18/2018  . Uterine fibroid 01/18/2018    Jule Ser, PT 06/21/2019, 2:49 PM   [facility] [address], [place], [place], [address] Phone: [phone]   Fax:  [phone]  Name: Tasha Payne MRN: [id number] Date of Birth: 1971/04/10

## 2019-06-23 ENCOUNTER — Encounter: Payer: Self-pay | Admitting: Physical Therapy

## 2019-06-23 ENCOUNTER — Other Ambulatory Visit: Payer: Self-pay

## 2019-06-23 ENCOUNTER — Ambulatory Visit: Payer: Medicaid Other | Admitting: Physical Therapy

## 2019-06-23 DIAGNOSIS — M62830 Muscle spasm of back: Secondary | ICD-10-CM

## 2019-06-23 DIAGNOSIS — M545 Low back pain, unspecified: Secondary | ICD-10-CM

## 2019-06-23 DIAGNOSIS — M6281 Muscle weakness (generalized): Secondary | ICD-10-CM

## 2019-06-23 NOTE — Therapy (Signed)
[facility] [address] [place], [place], [address] Phone: [phone]   Fax:  [phone]  Physical Therapy Treatment  Patient Details  Name: Tasha Payne MRN: [id number] Date of Birth: [birth year] Referring Provider (PT): Rolm Bookbinder, MD   Encounter Date: 06/23/2019  PT End of Session - 06/23/19 1400    Visit Number  7    Date for PT Re-Evaluation  07/21/19    PT Start Time  1400    PT Stop Time  1450    PT Time Calculation (min)  50 min    Activity Tolerance  Patient tolerated treatment well    Behavior During Therapy  [facility] for tasks assessed/performed       Past Medical History:  Diagnosis Date  . Anemia   . Fibroid   . Medical history non-contributory     Past Surgical History:  Procedure Laterality Date  . APPLICATION OF WOUND VAC  03/09/2019   Procedure: APPLICATION OF WOUND VAC;  Surgeon: Rolm Bookbinder, MD;  Location: [place];  Service: General;;  . COLOSTOMY N/A 03/09/2019   Procedure: END DESCENDING COLOSTOMY;  Surgeon: Rolm Bookbinder, MD;  Location: [place];  Service: General;  Laterality: N/A;  . CYSTOSCOPY  03/09/2019   Procedure: CYSTOSCOPY;  Surgeon: Sloan Leiter, MD;  Location: [place];  Service: Gynecology;;  . PROCTOSCOPY N/A 03/09/2019   Procedure: PROCTOSCOPY;  Surgeon: Rolm Bookbinder, MD;  Location: [place];  Service: General;  Laterality: N/A;  . TOTAL LAPAROSCOPIC HYSTERECTOMY WITH SALPINGECTOMY Bilateral 03/09/2019   Procedure: HYSTERECTOMY TOTAL LAPAROSCOPIC WITH BILATERAL SALPINGECTOMY;  Surgeon: Sloan Leiter, MD;  Location: [place];  Service: Gynecology;  Laterality: Bilateral;  . TUBAL LIGATION  1996    There were no vitals filed for this visit.  Subjective Assessment - 06/23/19 1439    Subjective  I am pretty sore today around the ostomy.    Patient Stated Goals  feel  better and return to work    Currently in Pain?  Yes    Pain Score  6     Pain Location  Abdomen    Pain Orientation  Lower;Left    Pain Descriptors / Indicators  Sore    Pain Type  Surgical pain    Pain Onset  More than a month ago    Multiple Pain Sites  No                       [facility] Adult PT Treatment/Exercise - 06/23/19 0001      Electrical Stimulation   Electrical Stimulation Location  lumbar    Electrical Stimulation Action  IFC    Electrical Stimulation Parameters  to tolerance    Electrical Stimulation Goals  Pain      Manual Therapy   Manual Therapy  Myofascial release    Soft tissue mobilization  lumbar paraspinals - sacral distraction - Rt sidelying    Myofascial Release  release to Rt lumbar and thoracic paraspinals               PT Short Term Goals - 06/21/19 1433      PT SHORT TERM GOAL #2   Title  pt will notice she can perform daily tasks with 30% less discomfort    Baseline  no changes    Status  On-going        PT Long Term Goals - 06/21/19  [place] #4   Title  Pt will be able to sit up in bed with at least 80% less pain and greater ease due to improved core strength    Baseline  same    Status  On-going            Plan - 06/23/19 1439    Clinical Impression Statement  Pt was having increased pain today.  Did not do abdominal strengthening exercises today due to trying to reduce inflammation in that area.  Pt got good release in low back and gluteals with STM and myofascial release.  Pt will benefit from skilled PT to continue to help with pain manaement while she regains core strength.    PT Treatment/Interventions  ADLs/Self Care Home Management;Biofeedback;Cryotherapy;Electrical Stimulation;Moist Heat;Therapeutic exercise;Therapeutic activities;Neuromuscular re-education;Manual techniques;Scar mobilization;Passive range of motion;Taping;Dry needling;Patient/family education    PT Next Visit Plan   core and pelvic floor strength, STM to lumbar paraspinals, TENS and heat    PT Home Exercise Plan  Access Code: [email or web address]    Consulted and Agree with Plan of Care  Patient       Patient will benefit from skilled therapeutic intervention in order to improve the following deficits and impairments:  Abnormal gait, Decreased range of motion, Difficulty walking, Increased fascial restricitons, Pain, Decreased activity tolerance, Decreased strength, Postural dysfunction, Impaired flexibility  Visit Diagnosis: Muscle spasm of back  Acute midline low back pain without sciatica  Muscle weakness (generalized)     Problem List Patient Active Problem List   Diagnosis Date Noted  . Rectal injury 03/14/2019  . Rectovaginal fistula 03/14/2019  . Colostomy in place [facility]) 03/14/2019  . Surgical complication 123XX123  . Abnormal uterine bleeding (AUB) 03/09/2019  . S/P hysterectomy 03/09/2019  . S/P laparoscopic hysterectomy 03/09/2019  . Iron deficiency anemia due to chronic blood loss 01/19/2018  . Symptomatic anemia 01/18/2018  . Menorrhagia 01/18/2018  . Uterine fibroid 01/18/2018    Jule Ser, PT 06/23/2019, 3:00 PM  [facility] [address], [place], [place], [address] Phone: [phone]   Fax:  [phone]  Name: Tasha Payne MRN: [id number] Date of Birth: 12-04-1970

## 2019-06-28 ENCOUNTER — Ambulatory Visit: Payer: Medicaid Other | Admitting: Physical Therapy

## 2019-06-30 ENCOUNTER — Ambulatory Visit: Payer: Medicaid Other | Admitting: Physical Therapy

## 2019-07-05 ENCOUNTER — Other Ambulatory Visit: Payer: Self-pay

## 2019-07-05 ENCOUNTER — Encounter: Payer: Self-pay | Admitting: Physical Therapy

## 2019-07-05 ENCOUNTER — Ambulatory Visit: Payer: Medicaid Other | Admitting: Physical Therapy

## 2019-07-05 DIAGNOSIS — M545 Low back pain, unspecified: Secondary | ICD-10-CM

## 2019-07-05 DIAGNOSIS — M62830 Muscle spasm of back: Secondary | ICD-10-CM

## 2019-07-05 DIAGNOSIS — M6281 Muscle weakness (generalized): Secondary | ICD-10-CM

## 2019-07-05 NOTE — Therapy (Signed)
[facility] [address] [place], [place], [address] Phone: [phone]   Fax:  [phone]  Physical Therapy Treatment  Patient Details  Name: Tasha Payne MRN: [id number] Date of Birth: [birth year] Referring Provider (PT): Rolm Bookbinder, MD   Encounter Date: 07/05/2019  PT End of Session - 07/05/19 1319    Visit Number  8    Date for PT Re-Evaluation  07/21/19    PT Start Time  1232    PT Stop Time  1315    PT Time Calculation (min)  43 min    Activity Tolerance  Patient tolerated treatment well    Behavior During Therapy  [facility] for tasks assessed/performed       Past Medical History:  Diagnosis Date  . Anemia   . Fibroid   . Medical history non-contributory     Past Surgical History:  Procedure Laterality Date  . APPLICATION OF WOUND VAC  03/09/2019   Procedure: APPLICATION OF WOUND VAC;  Surgeon: Rolm Bookbinder, MD;  Location: [place];  Service: General;;  . COLOSTOMY N/A 03/09/2019   Procedure: END DESCENDING COLOSTOMY;  Surgeon: Rolm Bookbinder, MD;  Location: [place];  Service: General;  Laterality: N/A;  . CYSTOSCOPY  03/09/2019   Procedure: CYSTOSCOPY;  Surgeon: Sloan Leiter, MD;  Location: [place];  Service: Gynecology;;  . PROCTOSCOPY N/A 03/09/2019   Procedure: PROCTOSCOPY;  Surgeon: Rolm Bookbinder, MD;  Location: [place];  Service: General;  Laterality: N/A;  . TOTAL LAPAROSCOPIC HYSTERECTOMY WITH SALPINGECTOMY Bilateral 03/09/2019   Procedure: HYSTERECTOMY TOTAL LAPAROSCOPIC WITH BILATERAL SALPINGECTOMY;  Surgeon: Sloan Leiter, MD;  Location: [place];  Service: Gynecology;  Laterality: Bilateral;  . TUBAL LIGATION  1996    There were no vitals filed for this visit.  Subjective Assessment - 07/05/19 1234    Subjective  I am still getting sore when I stand for a long time.  Less leakage since  last time.  The massage helps with the pain for a little while.    Currently in Pain?  Yes    Pain Score  4     Pain Location  Abdomen    Pain Orientation  Lower;Left    Pain Descriptors / Indicators  Sore;Aching    Pain Type  Surgical pain    Pain Radiating Towards  around to the back    Pain Onset  More than a month ago    Pain Frequency  Constant    Multiple Pain Sites  No                       [facility] Adult PT Treatment/Exercise - 07/05/19 0001      Lumbar Exercises: Stretches   Active Hamstring Stretch  Right;Left;2 reps;20 seconds    Lower Trunk Rotation  2 reps;20 seconds    Piriformis Stretch  Right;Left;2 reps;20 seconds      Lumbar Exercises: Standing   Wall Slides  5 seconds;15 reps   kegel hold   Shoulder Extension  Strengthening;Both;20 reps   bracing    Theraband Level (Shoulder Extension)  Level 1 (Yellow)      Lumbar Exercises: Seated   Sit to Stand  10 reps   kegel eccentric control     Lumbar Exercises: Supine   Bridge  10 reps;5 seconds   mini lift     Moist Heat Therapy   Number Minutes Moist  Heat  8 Minutes    Moist Heat Location  Lumbar Spine   during stretches in supine     Manual Therapy   Soft tissue mobilization  lumbar paraspinals - sacral distraction - Rt sidelying    Myofascial Release  release to Rt lumbar and thoracic paraspinals             PT Education - 07/05/19 1312    Education Details  Access Code: [facility]       PT Short Term Goals - 06/21/19 1433      PT SHORT TERM GOAL #2   Title  pt will notice she can perform daily tasks with 30% less discomfort    Baseline  no changes    Status  On-going        PT Long Term Goals - 07/05/19 1320      PT LONG TERM GOAL #1   Title  pt will report at least 60% less pain    Status  On-going      PT LONG TERM GOAL #2   Title  Pt will be able to stand and walk for at least 6 hours in a day in order to be able to work on endurance for returning to work     Status  On-going      PT [place] #3   Title  pt will be ind with advanced HEP    Status  On-going      PT LONG TERM GOAL #4   Title  Pt will be able to sit up in bed with at least 80% less pain and greater ease due to improved core strength    Status  On-going            Plan - 07/05/19 1320    Clinical Impression Statement  Pt was able to progress exercises today. She is feeling less leakage since adding pelvic floor strengthening to her exercises. Pt is progressing slowly due to surgical pain.  pt states she feels like the massage is helping to decrease pain a little.  pt will continue to benefit from skilled PT to progress core and pelvic floor and pain management    PT Treatment/Interventions  ADLs/Self Care Home Management;Biofeedback;Cryotherapy;Electrical Stimulation;Moist Heat;Therapeutic exercise;Therapeutic activities;Neuromuscular re-education;Manual techniques;Scar mobilization;Passive range of motion;Taping;Dry needling;Patient/family education    PT Next Visit Plan  core and pelvic floor strength progression, STM to lumbar paraspinals, TENS and heat    PT Home Exercise Plan  Access Code: [email or web address]    Consulted and Agree with Plan of Care  Patient       Patient will benefit from skilled therapeutic intervention in order to improve the following deficits and impairments:  Abnormal gait, Decreased range of motion, Difficulty walking, Increased fascial restricitons, Pain, Decreased activity tolerance, Decreased strength, Postural dysfunction, Impaired flexibility  Visit Diagnosis: Muscle spasm of back  Acute midline low back pain without sciatica  Muscle weakness (generalized)     Problem List Patient Active Problem List   Diagnosis Date Noted  . Rectal injury 03/14/2019  . Rectovaginal fistula 03/14/2019  . Colostomy in place [facility]) 03/14/2019  . Surgical complication 123XX123  . Abnormal uterine bleeding (AUB) 03/09/2019  . S/P hysterectomy 03/09/2019   . S/P laparoscopic hysterectomy 03/09/2019  . Iron deficiency anemia due to chronic blood loss 01/19/2018  . Symptomatic anemia 01/18/2018  . Menorrhagia 01/18/2018  . Uterine fibroid 01/18/2018    Jule Ser, PT 07/05/2019, 1:23 PM  [facility] [address] [place], [place], [address] Phone: [phone]   Fax:  [phone]  Name: TIWANA CHRISTESON MRN: [id number] Date of Birth: 1970-12-31

## 2019-07-05 NOTE — Patient Instructions (Signed)
Access Code: [facility]  URL: [email or web address]  Date: 07/05/2019  Prepared by: Jari Favre   Exercises  Supine Hamstring Stretch - 3 reps - 1 sets - 30 sec hold - 1x daily - 7x weekly  Supine Lower Trunk Rotation - 10 reps - 1 sets - 5 sec hold - 1x daily - 7x weekly  Supine Single Knee to Chest Stretch - 5 reps - 1 sets - 10 sec hold - 2x daily - 7x weekly  Supine Transversus Abdominis Bracing - Hands on Stomach - 10 reps - 3 sets - 1x daily - 7x weekly  Supine Transversus Abdominis Bracing with Double Leg Fallout - 10 reps - 2 sets - 1x daily - 7x weekly  Hooklying Small March - 10 reps - 2 sets - 1x daily - 7x weekly  Hooklying clam with kegel - 10 reps - 2 sets - 1x daily - 7x weekly  Seated Pelvic Floor Contraction with Isometric Hip Adduction - 10 reps - 1 sets - 3 sechold, 3 sec rest hold - 3x daily - 7x weekly  Seated Pelvic Floor Contraction with Hip Abduction and Resistance Loop - 10 reps - 1 sets - 3 sec hold - 3x daily - 7x weekly  Standing Shoulder Diagonal Horizontal Abduction 60/120 Degrees with Resistance - 10 reps - 3 sets - 1x daily - 7x weekly  Standing Shoulder Horizontal Abduction with Resistance - 10 reps - 3 sets - 1x daily - 7x weekly  Seated Shoulder Flexion with Dumbbells - 10 reps - 3 sets - 1x daily - 7x weekly  Supine Pelvic Floor Contraction with Hip Internal External Rotation - 10 reps - 1 sets - 10 sec hold - 2x daily - 7x weekly  Wall Slide with Posterior Pelvic Tilt - 10 reps - 2 sets - 5 sec hold - 1x daily - 7x weekly  Supine Bridge with Pelvic Floor Contraction - 10 reps - 3 sets - 1x daily - 7x weekly

## 2019-07-07 ENCOUNTER — Other Ambulatory Visit: Payer: Self-pay

## 2019-07-07 ENCOUNTER — Encounter: Payer: Self-pay | Admitting: Physical Therapy

## 2019-07-07 ENCOUNTER — Ambulatory Visit: Payer: Medicaid Other | Admitting: Physical Therapy

## 2019-07-07 DIAGNOSIS — M545 Low back pain, unspecified: Secondary | ICD-10-CM

## 2019-07-07 DIAGNOSIS — M62830 Muscle spasm of back: Secondary | ICD-10-CM

## 2019-07-07 DIAGNOSIS — M6281 Muscle weakness (generalized): Secondary | ICD-10-CM

## 2019-07-07 NOTE — Therapy (Signed)
[facility] [address] [place], [place], [address] Phone: [phone]   Fax:  [phone]  Physical Therapy Treatment  Patient Details  Name: Tasha Payne MRN: [id number] Date of Birth: July 13, 1971 Referring Provider (PT): Rolm Bookbinder, MD   Encounter Date: 07/07/2019  PT End of Session - 07/07/19 1230    Visit Number  9    Date for PT Re-Evaluation  07/21/19    PT Start Time  3893    PT Stop Time  1312    PT Time Calculation (min)  41 min    Activity Tolerance  Patient tolerated treatment well    Behavior During Therapy  [facility] for tasks assessed/performed       Past Medical History:  Diagnosis Date  . Anemia   . Fibroid   . Medical history non-contributory     Past Surgical History:  Procedure Laterality Date  . APPLICATION OF WOUND VAC  03/09/2019   Procedure: APPLICATION OF WOUND VAC;  Surgeon: Rolm Bookbinder, MD;  Location: [place];  Service: General;;  . COLOSTOMY N/A 03/09/2019   Procedure: END DESCENDING COLOSTOMY;  Surgeon: Rolm Bookbinder, MD;  Location: [place];  Service: General;  Laterality: N/A;  . CYSTOSCOPY  03/09/2019   Procedure: CYSTOSCOPY;  Surgeon: Sloan Leiter, MD;  Location: [place];  Service: Gynecology;;  . PROCTOSCOPY N/A 03/09/2019   Procedure: PROCTOSCOPY;  Surgeon: Rolm Bookbinder, MD;  Location: [place];  Service: General;  Laterality: N/A;  . TOTAL LAPAROSCOPIC HYSTERECTOMY WITH SALPINGECTOMY Bilateral 03/09/2019   Procedure: HYSTERECTOMY TOTAL LAPAROSCOPIC WITH BILATERAL SALPINGECTOMY;  Surgeon: Sloan Leiter, MD;  Location: [place];  Service: Gynecology;  Laterality: Bilateral;  . TUBAL LIGATION  1996    There were no vitals filed for this visit.  Subjective Assessment - 07/07/19 1235    Subjective  I felt okay just tired after the last session    Limitations   Lifting;Walking;Standing    How long can you stand comfortably?  2 hours at the most    How long can you walk comfortably?  2 hours at the most    Patient Stated Goals  feel better and return to work    Currently in Pain?  Yes    Pain Score  4     Pain Location  Back    Pain Orientation  Lower    Pain Descriptors / Indicators  Sore;Aching    Pain Type  Surgical pain    Pain Onset  More than a month ago    Pain Frequency  Intermittent    Multiple Pain Sites  No                       [facility] Adult PT Treatment/Exercise - 07/07/19 0001      Lumbar Exercises: Stretches   Active Hamstring Stretch  Right;Left;2 reps;20 seconds    Hip Flexor Stretch  Right;Left;3 reps;20 seconds   standing foot on step   Figure 4 Stretch  5 reps;10 seconds;Supine   rolling ball      Lumbar Exercises: Aerobic   Nustep  L2 x 10 min - seat 8; UE 9      Lumbar Exercises: Standing   Wall Slides  5 seconds;15 reps   kegel hold - red ball behind back   Wall Slides Limitations  wall push up - core engaged - 20x    Shoulder  Extension  Strengthening;Both;20 reps    Theraband Level (Shoulder Extension)  Level 1 (Yellow)    Shoulder Extension Limitations  bringing arms back and reverse with stepping forward - 15x each way               PT Short Term Goals - 07/07/19 1235      PT SHORT TERM GOAL #2   Title  pt will notice she can perform daily tasks with 30% less discomfort    Baseline  50-60%    Status  Achieved        PT Long Term Goals - 07/07/19 1235      PT LONG TERM GOAL #1   Title  pt will report at least 60% less pain    Baseline  50-60%    Status  Partially Met      PT LONG TERM GOAL #2   Title  Pt will be able to stand and walk for at least 6 hours in a day in order to be able to work on endurance for returning to work    Baseline  2 hours    Status  On-going      PT LONG TERM GOAL #3   Title  pt will be ind with advanced HEP    Status  On-going             Plan - 07/07/19 1325    Clinical Impression Statement  Pt tolerated exercises well today.  No increased pain.  She needs extra time with exercises to focus on activating the correct muscles.  Pt felt relief with stretches after doing the strengthening.  Pt will continue to benefit from skilled PT to progress core and pelvic floor strength and endurance to work towrads regaining function after upcoming surgeries.    PT Treatment/Interventions  ADLs/Self Care Home Management;Biofeedback;Cryotherapy;Electrical Stimulation;Moist Heat;Therapeutic exercise;Therapeutic activities;Neuromuscular re-education;Manual techniques;Scar mobilization;Passive range of motion;Taping;Dry needling;Patient/family education    PT Next Visit Plan  f/u on on findings from procedure, core and pelvic floor strength progression, STM to lumbar paraspinals, TENS and heat    PT Home Exercise Plan  Access Code: [email or web address]    Consulted and Agree with Plan of Care  Patient       Patient will benefit from skilled therapeutic intervention in order to improve the following deficits and impairments:  Abnormal gait, Decreased range of motion, Difficulty walking, Increased fascial restricitons, Pain, Decreased activity tolerance, Decreased strength, Postural dysfunction, Impaired flexibility  Visit Diagnosis: Muscle spasm of back  Acute midline low back pain without sciatica  Muscle weakness (generalized)     Problem List Patient Active Problem List   Diagnosis Date Noted  . Rectal injury 03/14/2019  . Rectovaginal fistula 03/14/2019  . Colostomy in place [facility]) 03/14/2019  . Surgical complication 76/72/0947  . Abnormal uterine bleeding (AUB) 03/09/2019  . S/P hysterectomy 03/09/2019  . S/P laparoscopic hysterectomy 03/09/2019  . Iron deficiency anemia due to chronic blood loss 01/19/2018  . Symptomatic anemia 01/18/2018  . Menorrhagia 01/18/2018  . Uterine fibroid 01/18/2018    Jule Ser,  PT 07/07/2019, 1:27 PM  [facility] [address], [place], [place], [address] Phone: [phone]   Fax:  [phone]  Name: Tasha Payne MRN: [id number] Date of Birth: 09/30/1970

## 2019-07-14 ENCOUNTER — Ambulatory Visit: Payer: Self-pay | Admitting: Physical Therapy

## 2019-07-21 ENCOUNTER — Ambulatory Visit: Payer: Self-pay | Admitting: Physical Therapy

## 2019-07-28 ENCOUNTER — Other Ambulatory Visit: Payer: Self-pay

## 2019-07-28 ENCOUNTER — Ambulatory Visit: Payer: Self-pay | Attending: General Surgery | Admitting: Physical Therapy

## 2019-07-28 DIAGNOSIS — M545 Low back pain, unspecified: Secondary | ICD-10-CM

## 2019-07-28 DIAGNOSIS — M62830 Muscle spasm of back: Secondary | ICD-10-CM | POA: Insufficient documentation

## 2019-07-28 DIAGNOSIS — M6281 Muscle weakness (generalized): Secondary | ICD-10-CM | POA: Insufficient documentation

## 2019-07-28 NOTE — Therapy (Signed)
[facility] [address] [place], [place], [address] Phone: [phone]   Fax:  [phone]  Physical Therapy Treatment  Patient Details  Name: Tasha Payne MRN: [id number] Date of Birth: 1971/04/28 Referring Provider (PT): Emelia Loron, MD   Encounter Date: 07/28/2019  PT End of Session - 07/28/19 1211    Visit Number  10    Date for PT Re-Evaluation  07/28/19    PT Start Time  1204    PT Stop Time  1248    PT Time Calculation (min)  44 min    Activity Tolerance  Patient tolerated treatment well    Behavior During Therapy  [facility] for tasks assessed/performed       Past Medical History:  Diagnosis Date  . Anemia   . Fibroid   . Medical history non-contributory     Past Surgical History:  Procedure Laterality Date  . APPLICATION OF WOUND VAC  03/09/2019   Procedure: APPLICATION OF WOUND VAC;  Surgeon: Emelia Loron, MD;  Location: [facility];  Service: General;;  . COLOSTOMY N/A 03/09/2019   Procedure: END DESCENDING COLOSTOMY;  Surgeon: Emelia Loron, MD;  Location: [facility];  Service: General;  Laterality: N/A;  . CYSTOSCOPY  03/09/2019   Procedure: CYSTOSCOPY;  Surgeon: Conan Bowens, MD;  Location: [facility];  Service: Gynecology;;  . PROCTOSCOPY N/A 03/09/2019   Procedure: PROCTOSCOPY;  Surgeon: Emelia Loron, MD;  Location: [facility];  Service: General;  Laterality: N/A;  . TOTAL LAPAROSCOPIC HYSTERECTOMY WITH SALPINGECTOMY Bilateral 03/09/2019   Procedure: HYSTERECTOMY TOTAL LAPAROSCOPIC WITH BILATERAL SALPINGECTOMY;  Surgeon: Conan Bowens, MD;  [facility];  Service: Gynecology;  Laterality: Bilateral;  . TUBAL LIGATION  1996    There were no vitals filed for this visit.  Subjective Assessment - 07/28/19 1219    Subjective  I feel better and feel like I can continue the exercises until I have the  surgery.  Pt states exploratory procedure went well and is looking like she will tolerate surgery soon.  She reports 60-80% less pain, no leakage, and she is able to tolerate up to 4 hours of activities currently.    Patient Stated Goals  feel better and return to work    Currently in Pain?  Yes    Pain Score  3     Pain Location  Back    Pain Orientation  Lower    Pain Type  Surgical pain    Pain Onset  More than a month ago    Pain Frequency  Intermittent    Multiple Pain Sites  No         [facility] PT Assessment - 07/28/19 0001      Assessment   Medical Diagnosis  M54.9 (ICD-10-CM) - Dorsalgia, unspecified    Referring Provider (PT)  Emelia Loron, MD                Pelvic Floor Special Questions - 07/28/19 0001    Urinary frequency  I can hold it and not have leakage now, but frequency still high    Fluid intake  6-8 glasses/day        [facility] Adult PT Treatment/Exercise - 07/28/19 0001      Lumbar Exercises: Aerobic   UBE (Upper Arm Bike)  L1 x 3 min fwd/ 3 min back   PT present for status     Lumbar Exercises: Standing  Forward Lunge Limitations  fwd step up 6" - 15x each side    Other Standing Lumbar Exercises  heel sliders 3 ways - 10 x each    Other Standing Lumbar Exercises  single leg stand on green pad - UE pull with red band      Lumbar Exercises: Supine   Bridge  10 reps;5 seconds   mini lift   Straight Leg Raise  15 reps   bilat            PT Education - 07/28/19 1225    Education Details  Access Code: [facility]    Person(s) Educated  Patient    Methods  Explanation;Demonstration;Tactile cues;Verbal cues;Handout    Comprehension  Verbalized understanding;Returned demonstration       PT Short Term Goals - 07/07/19 1235      PT SHORT TERM GOAL #2   Title  pt will notice she can perform daily tasks with 30% less discomfort    Baseline  50-60%    Status  Achieved        PT Long Term Goals - 07/28/19 1206      PT LONG TERM  GOAL #1   Title  pt will report at least 60% less pain    Baseline  60%    Status  Achieved      PT LONG TERM GOAL #2   Title  Pt will be able to stand and walk for at least 6 hours in a day in order to be able to work on endurance for returning to work    Baseline  4 hours - increased by twice as much    Status  Partially Met      PT LONG TERM GOAL #3   Title  pt will be ind with advanced HEP    Status  Achieved      PT LONG TERM GOAL #4   Title  Pt will be able to sit up in bed with at least 80% less pain and greater ease due to improved core strength    Baseline  80% better    Status  Achieved            Plan - 07/28/19 1243    Clinical Impression Statement  Pt has met her long term goals at this time.  She is not having leakage and pain is reduced by 60-80%.  Patient was given a few updates and understands how to progress herself with her HEP. Pt will benefit from continueing to work on her exercises at home.  She may need to return after upcoming colostomy reversal surgery. Discharged today with HEP    PT Treatment/Interventions  ADLs/Self Care Home Management;Biofeedback;Cryotherapy;Electrical Stimulation;Moist Heat;Therapeutic exercise;Therapeutic activities;Neuromuscular re-education;Manual techniques;Scar mobilization;Passive range of motion;Taping;Dry needling;Patient/family education    PT Next Visit Plan  d/c today    PT Home Exercise Plan  Access Code: [email or web address]    Consulted and Agree with Plan of Care  Patient       Patient will benefit from skilled therapeutic intervention in order to improve the following deficits and impairments:  Abnormal gait, Decreased range of motion, Difficulty walking, Increased fascial restricitons, Pain, Decreased activity tolerance, Decreased strength, Postural dysfunction, Impaired flexibility  Visit Diagnosis: Muscle spasm of back  Acute midline low back pain without sciatica  Muscle weakness (generalized)     Problem  List Patient Active Problem List   Diagnosis Date Noted  . Rectal injury 03/14/2019  . Rectovaginal fistula 03/14/2019  . Colostomy  in place [facility]) 03/14/2019  . Surgical complication 03/09/2019  . Abnormal uterine bleeding (AUB) 03/09/2019  . S/P hysterectomy 03/09/2019  . S/P laparoscopic hysterectomy 03/09/2019  . Iron deficiency anemia due to chronic blood loss 01/19/2018  . Symptomatic anemia 01/18/2018  . Menorrhagia 01/18/2018  . Uterine fibroid 01/18/2018    Brayton Caves Javanni Maring 07/28/2019, 1:09 PM  [facility] [address] [place], [place], [address] Phone: [phone]   Fax:  [phone]  Name: Tasha Payne MRN: [id number] Date of Birth: 1971-01-20  PHYSICAL THERAPY DISCHARGE SUMMARY  Visits from Start of Care: 10  Current functional level related to goals / functional outcomes: See above   Remaining deficits: See above   Education / Equipment: HEP  Plan: Patient agrees to discharge.  Patient goals were met. Patient is being discharged due to meeting the stated rehab goals.  ?????    [employer], PT 07/28/19 1:10 PM

## 2019-08-02 ENCOUNTER — Encounter: Payer: Medicaid Other | Admitting: Physical Therapy

## 2019-09-28 IMAGING — US US PELVIS COMPLETE TRANSABD/TRANSVAG
1 series · 13 of 25 positions shown · non-contrast
Comparison: None

CLINICAL DATA: Vaginal bleeding for 3 weeks



[Series 1: us pelvis complete transabd/transvag · 0.20mm/px · 13 of 100 slices shown]
[im 1/100]
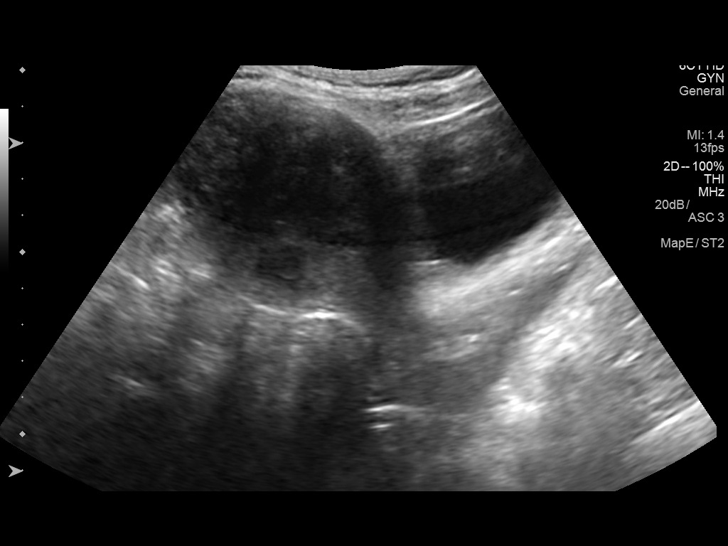
[im 9/100]
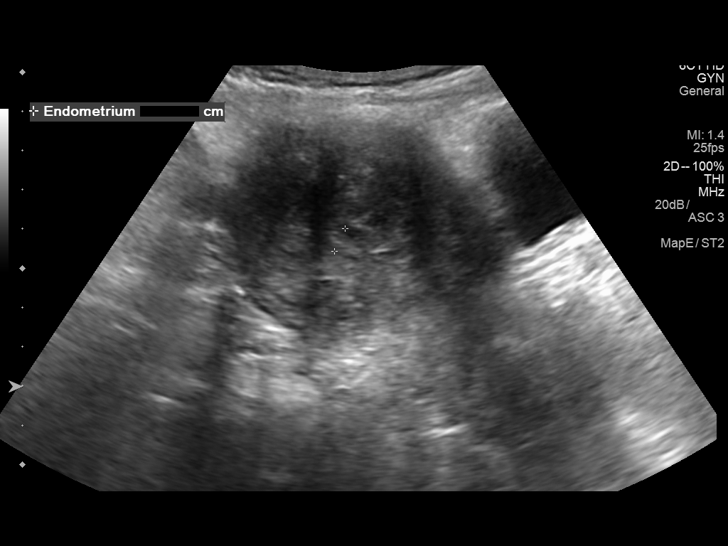
[im 17/100]
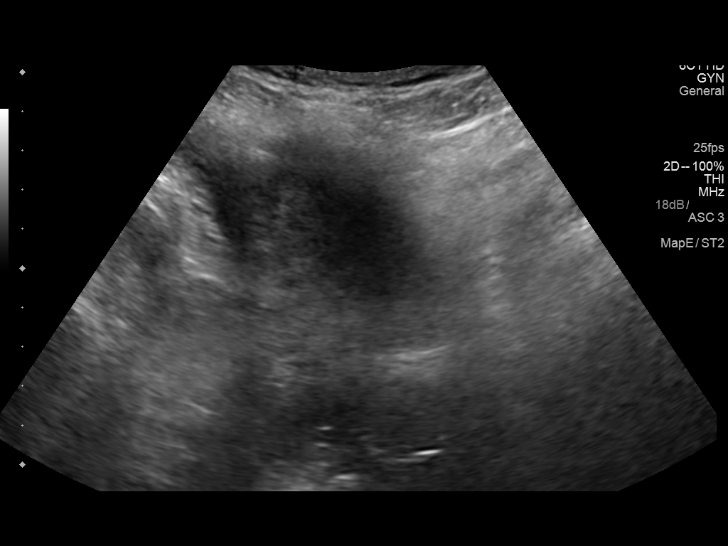
[im 25/100]
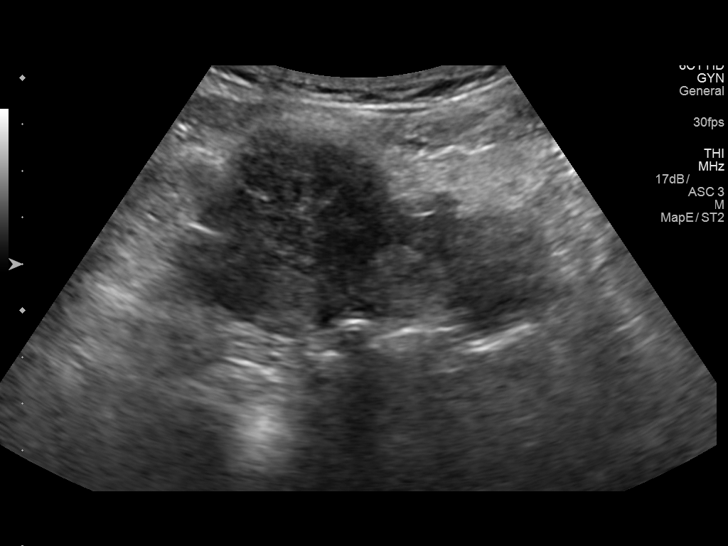
[im 34/100]
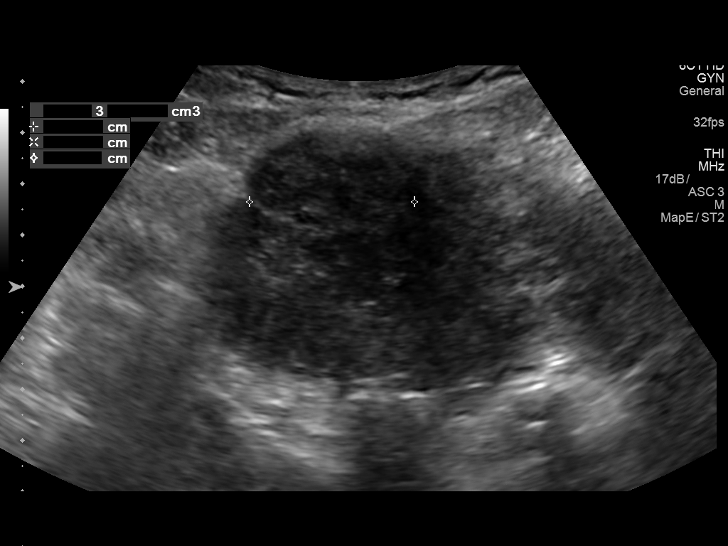
[im 42/100]
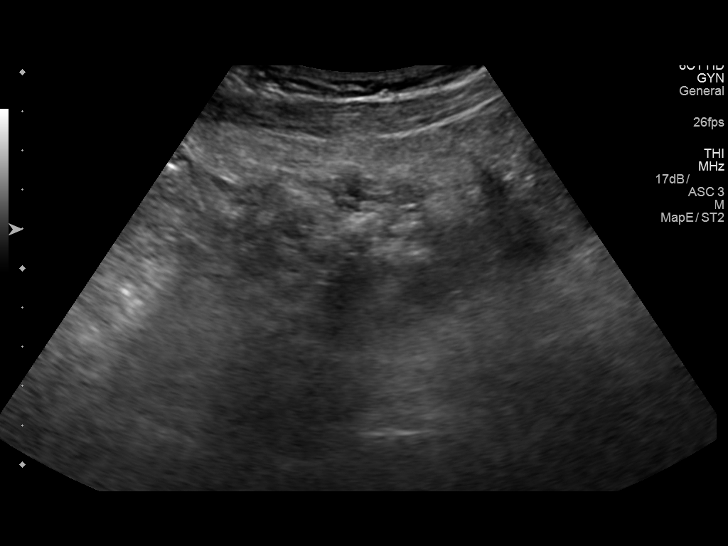
[im 50/100]
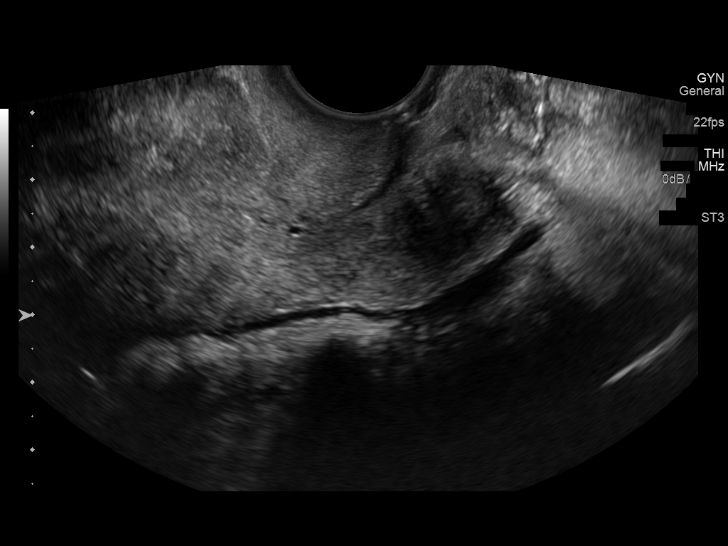
[im 58/100]
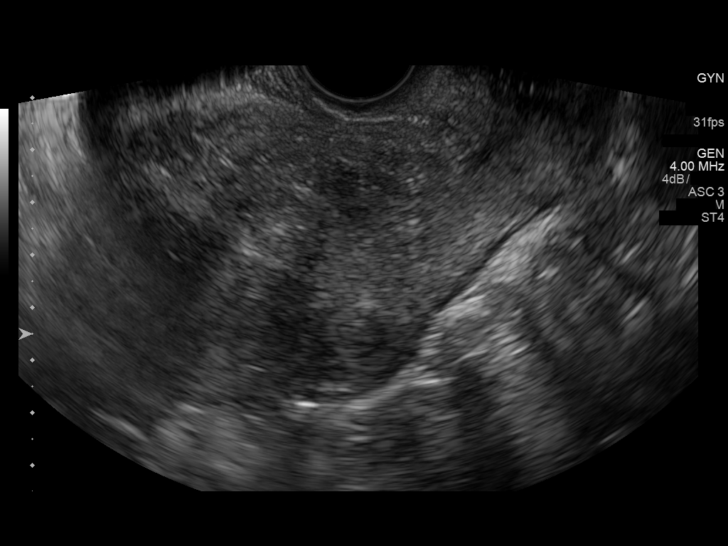
[im 67/100]
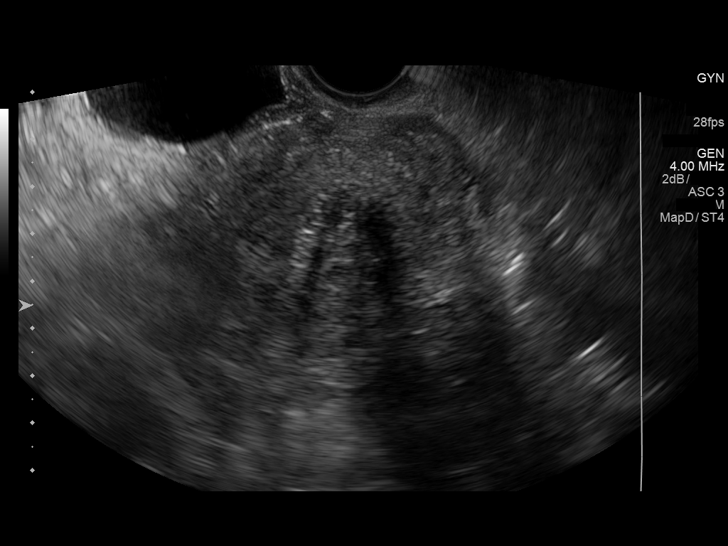
[im 75/100]
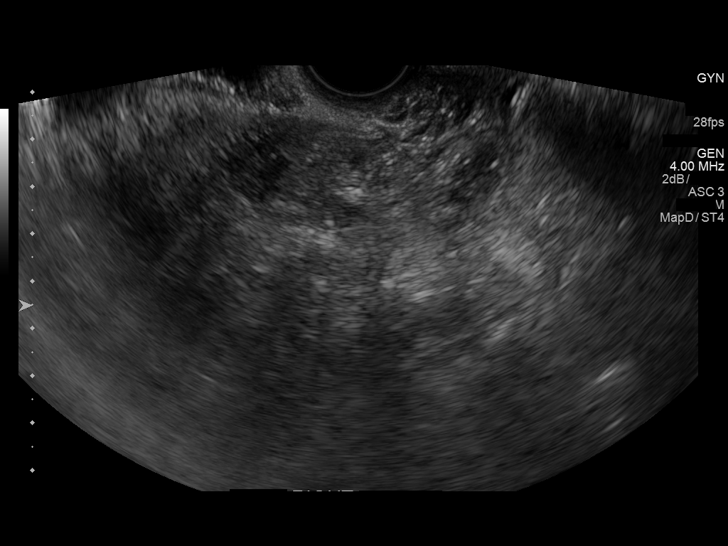
[im 83/100]
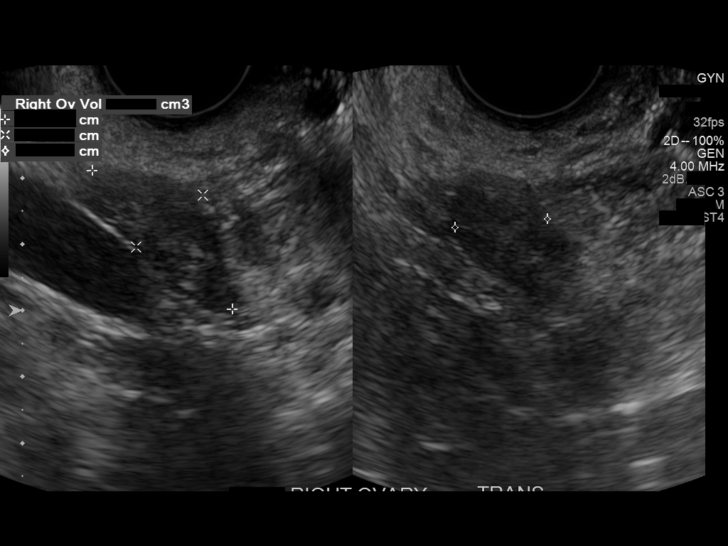
[im 91/100]
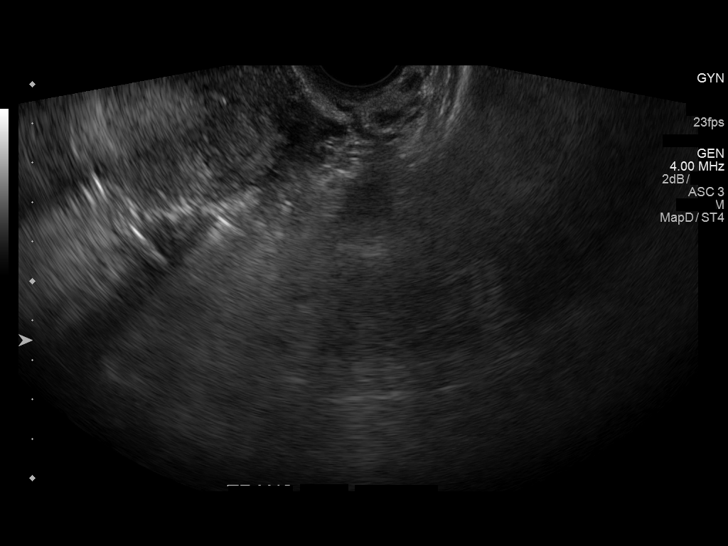
[im 100/100]
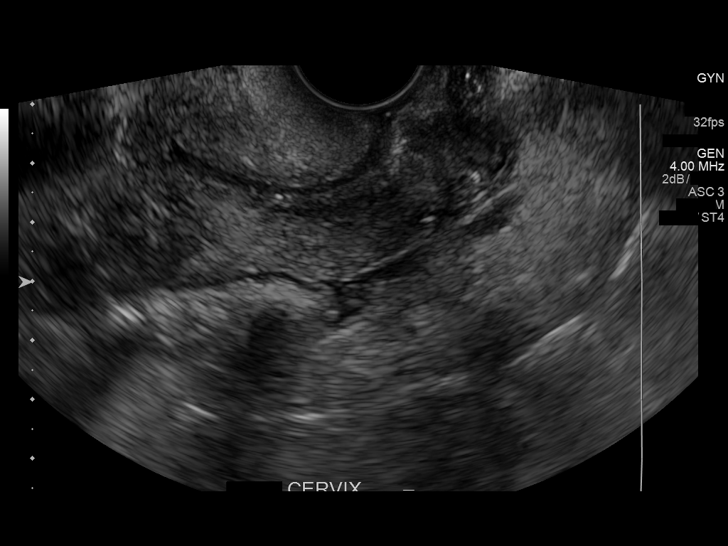

[13 of 25 positions shown; findings below may reference images not displayed]

FINDINGS: Uterus

Measurements: 11.3 x 6.2 x 6.6 cm. Multiple prominent fibroids. Left
lateral pedunculated fibroid measures 3.8 x 2.9 x 3.5 cm. Left
submucosal fibroid measures 2.9 x 2.9 x 2.8 cm. Fundal fibroid
measures 2.8 x 2.9 x 3.2 cm.

Endometrium

Thickness: 5.3 mm.  No focal abnormality visualized.

Right ovary

Measurements: 3 x 1.3 x 1.4 cm.  Normal appearance/no adnexal mass.

Left ovary

Not seen

Other findings

No abnormal free fluid.
IMPRESSION: 1. Enlarged fibroid uterus
2. Endometrial thickness of 5.3 mm. If bleeding remains unresponsive
to hormonal or medical therapy, sonohysterogram should be considered
for focal lesion work-up. (Ref: Radiological Reasoning: Algorithmic
Workup of Abnormal Vaginal Bleeding with Endovaginal Sonography and
Sonohysterography. AJR 7667; 191:S68-73)
3. Nonvisualized left ovary

## 2019-12-07 MED ORDER — SCOPOLAMINE 1 MG/3DAYS TD PT72
1.00 | MEDICATED_PATCH | TRANSDERMAL | Status: DC
Start: 2019-12-08 — End: 2019-12-07

## 2019-12-07 MED ORDER — GENERIC EXTERNAL MEDICATION
3.38 | Status: DC
Start: 2019-12-07 — End: 2019-12-07

## 2019-12-07 MED ORDER — CYCLOBENZAPRINE HCL 5 MG PO TABS
5.00 | ORAL_TABLET | ORAL | Status: DC
Start: ? — End: 2019-12-07

## 2019-12-07 MED ORDER — GENERIC EXTERNAL MEDICATION
600.00 | Status: DC
Start: ? — End: 2019-12-07

## 2019-12-07 MED ORDER — LOPERAMIDE HCL 2 MG PO CAPS
2.00 | ORAL_CAPSULE | ORAL | Status: DC
Start: 2019-12-07 — End: 2019-12-07

## 2019-12-07 MED ORDER — LACTATED RINGERS IV SOLN
INTRAVENOUS | Status: DC
Start: ? — End: 2019-12-07

## 2019-12-07 MED ORDER — ACETAMINOPHEN 160 MG/5ML PO SOLN
1000.00 | ORAL | Status: DC
Start: 2019-12-07 — End: 2019-12-07

## 2019-12-07 MED ORDER — ONDANSETRON HCL 4 MG/2ML IJ SOLN
4.00 | INTRAMUSCULAR | Status: DC
Start: ? — End: 2019-12-07

## 2019-12-07 MED ORDER — ENOXAPARIN SODIUM 40 MG/0.4ML ~~LOC~~ SOLN
40.00 | SUBCUTANEOUS | Status: DC
Start: 2019-12-08 — End: 2019-12-07

## 2019-12-07 MED ORDER — OXYCODONE HCL 5 MG PO TABS
5.00 | ORAL_TABLET | ORAL | Status: DC
Start: ? — End: 2019-12-07

## 2020-10-04 IMAGING — US US PELVIS COMPLETE TRANSABD/TRANSVAG
1 series · 13 of 25 positions shown · non-contrast
Comparison: Prior ultrasound from 01/18/2018

CLINICAL DATA: Initial evaluation for acute vaginal bleeding.
History of fibroids.



[Series 1: us pelvis complete transabd/transvag · 13 of 164 slices shown]
[im 1/164]
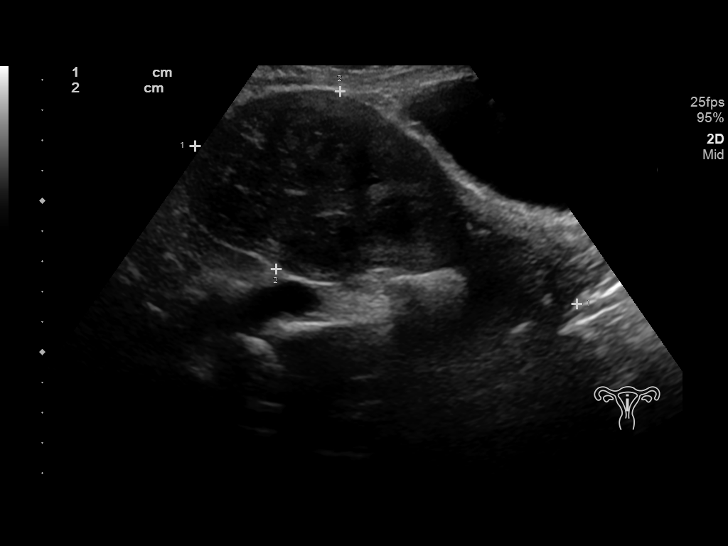
[im 14/164]
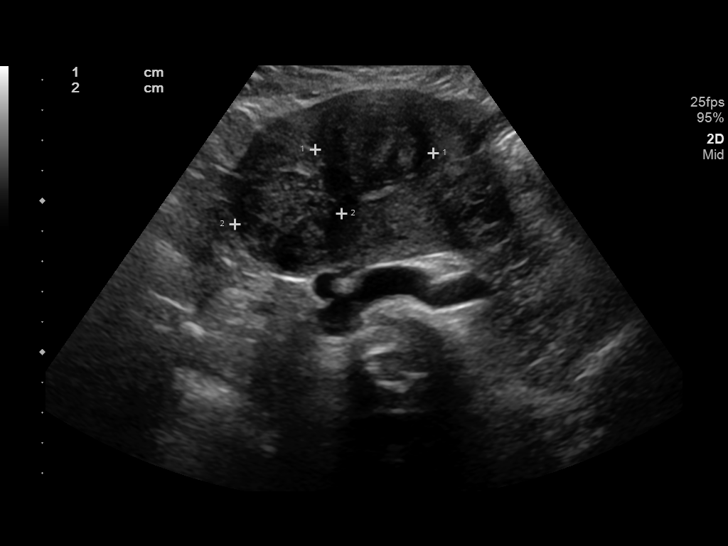
[im 28/164]
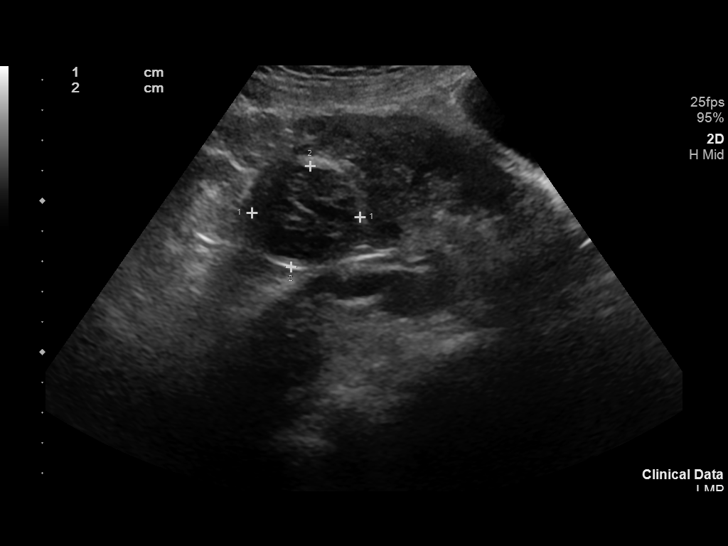
[im 41/164]
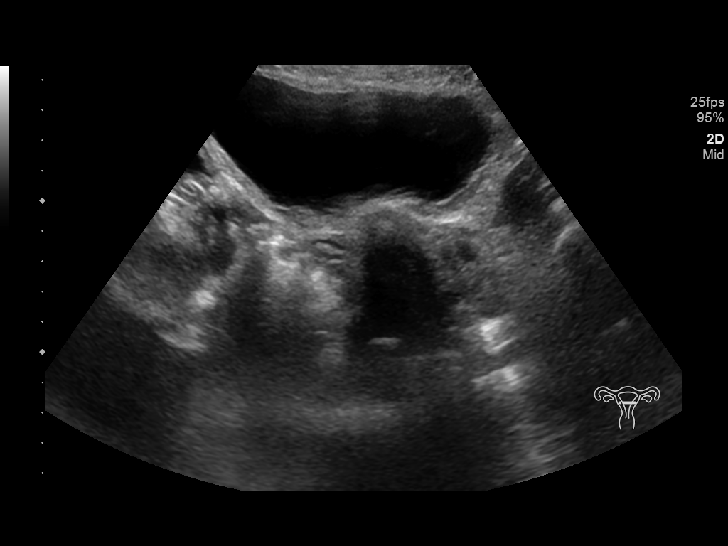
[im 55/164]
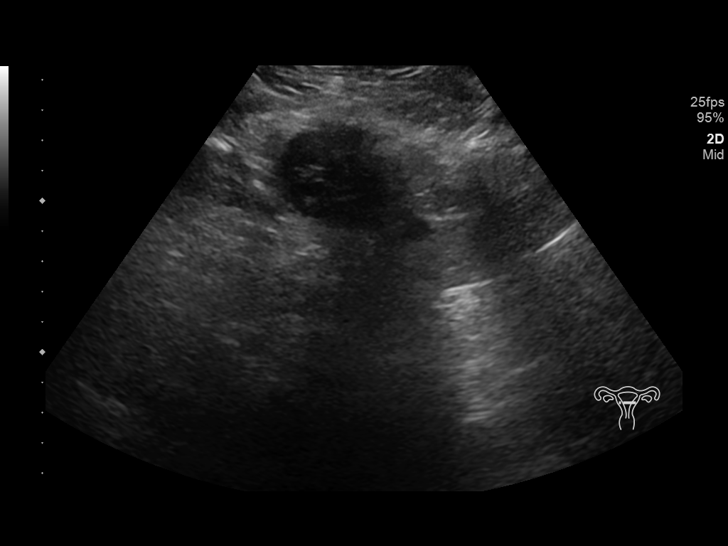
[im 68/164]
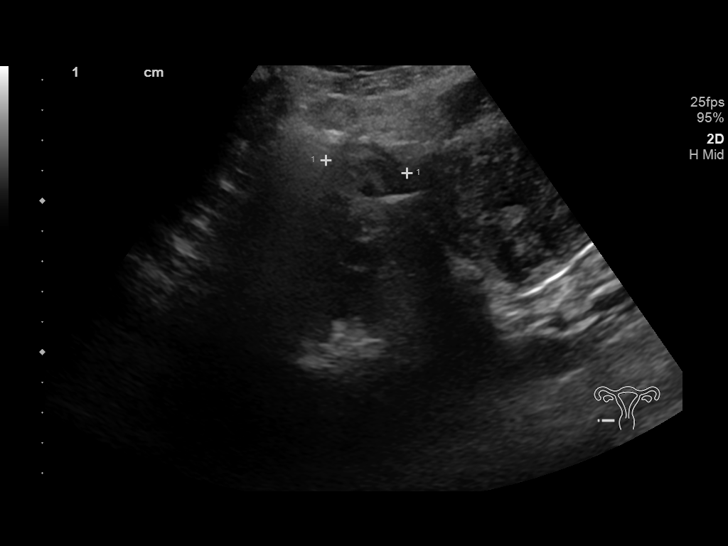
[im 82/164]
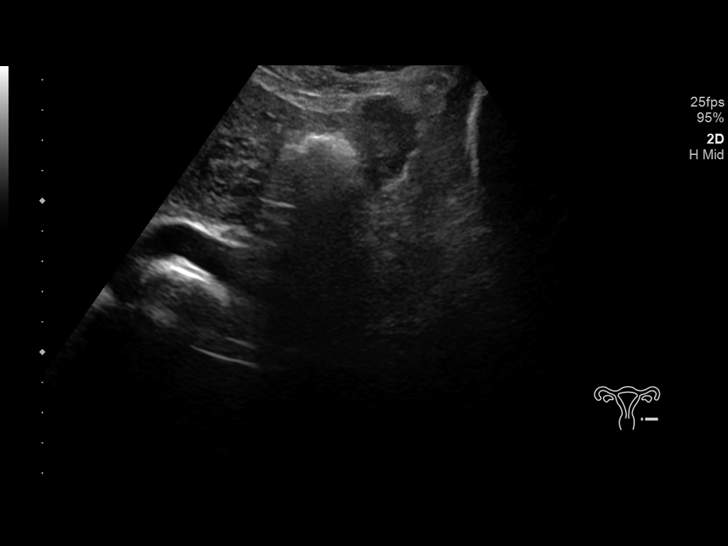
[im 96/164]
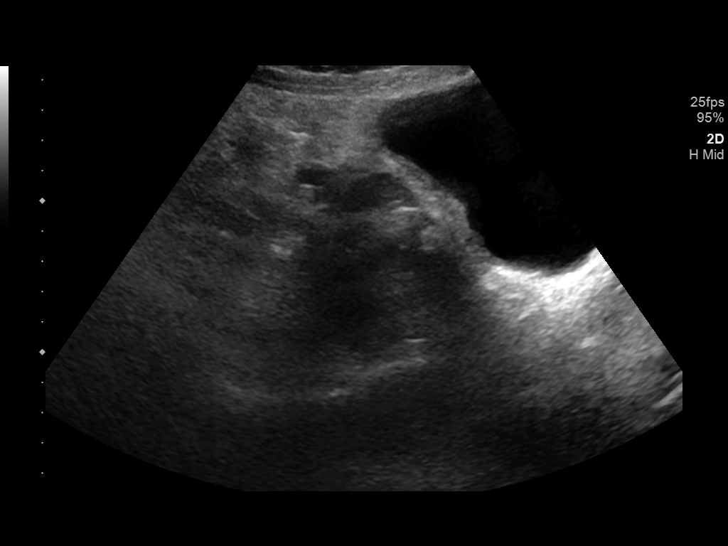
[im 109/164]
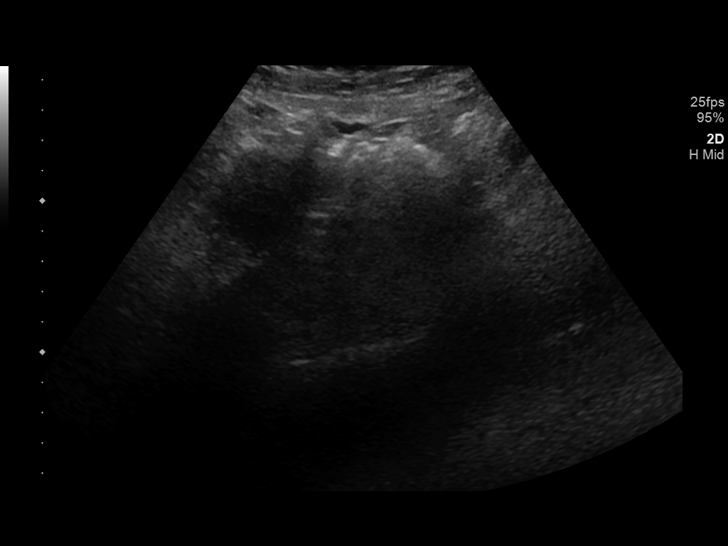
[im 123/164]
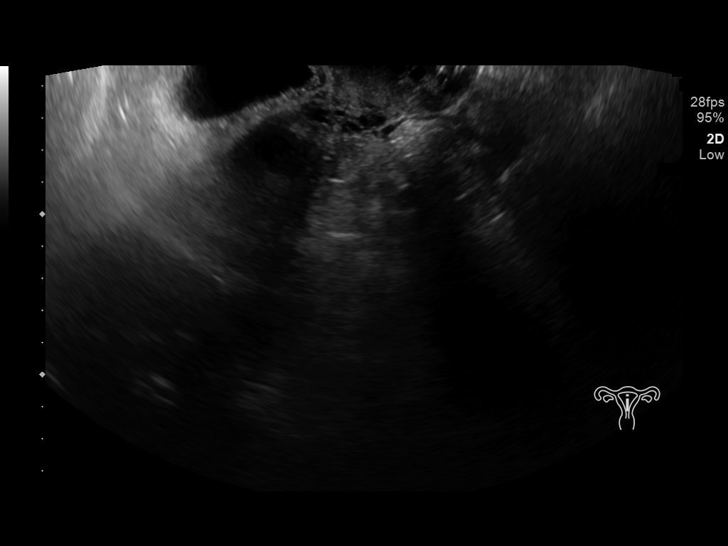
[im 136/164]
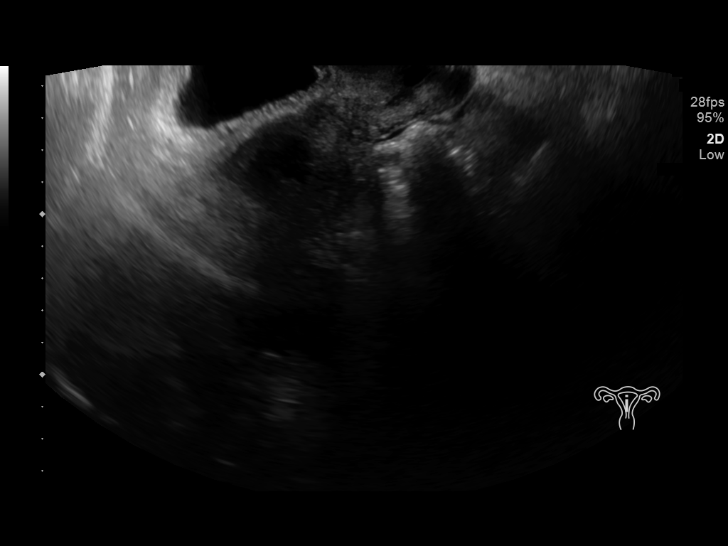
[im 150/164]
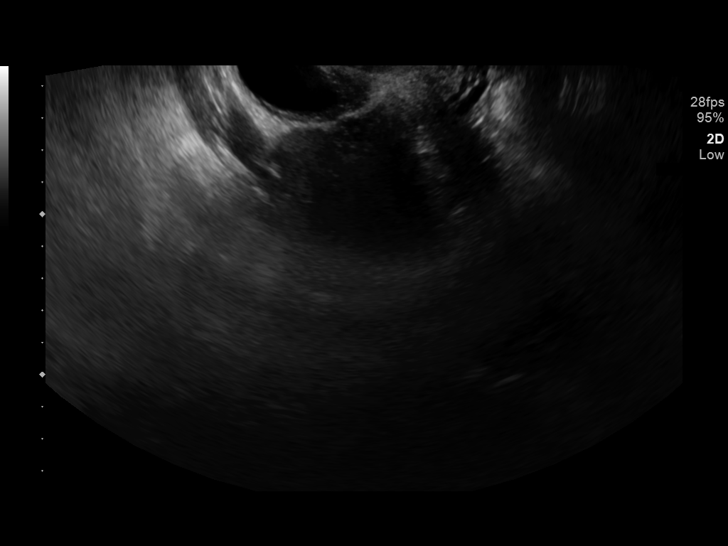
[im 164/164]
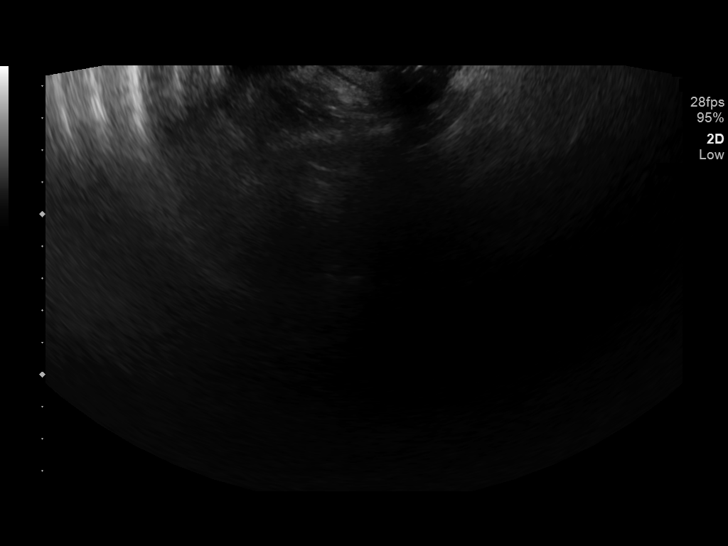

[13 of 25 positions shown; findings below may reference images not displayed]

FINDINGS: Uterus

Measurements: 13.7 x 6.2 x 4.0 cm. Multiple uterine fibroids are
seen as follows. 3.6 x 3.4 x 3.4 cm subserosal fibroid present at
the left mid uterine body. 3.4 x 3.4 x 3.9 cm sub mucosal fibroid
present at the central uterine fundus. 2.0 x 2.0 x 3.5 cm intramural
fibroid at the right mid uterine body. 4.5 x 4.8 x 4.0 cm exophytic
fibroid extends from the uterine fundus.

Endometrium

Thickness: 5 mm.  No focal abnormality visualized.

Right ovary

Measurements: 2.9 x 1.3 x 2.7 cm. Normal appearance/no adnexal mass.

Left ovary

Measurements: 4.1 x 2.5 x 2.4 cm. Normal appearance/no adnexal mass.

Other findings

No abnormal free fluid.
IMPRESSION: 1. Enlarged fibroid uterus as detailed above.
2. Endometrial stripe within normal limits measuring 5 mm in
thickness. If bleeding remains unresponsive to hormonal or medical
therapy, sonohysterogram should be considered for focal lesion
work-up. (Ref: Radiological Reasoning: Algorithmic Workup of
Abnormal Vaginal Bleeding with Endovaginal Sonography and
Sonohysterography. AJR 7990; 191:S68-73).
3. No other acute abnormality within the pelvis. Normal sonographic
appearance of the ovaries.

## 2021-05-20 ENCOUNTER — Other Ambulatory Visit: Payer: Self-pay

## 2021-05-20 ENCOUNTER — Emergency Department (HOSPITAL_BASED_OUTPATIENT_CLINIC_OR_DEPARTMENT_OTHER)
Admission: EM | Admit: 2021-05-20 | Discharge: 2021-05-20 | Disposition: A | Discharge status: Home / Self Care | Payer: Medicaid Other | Attending: Emergency Medicine | Admitting: Emergency Medicine

## 2021-05-20 ENCOUNTER — Encounter (HOSPITAL_BASED_OUTPATIENT_CLINIC_OR_DEPARTMENT_OTHER): Payer: Self-pay

## 2021-05-20 DIAGNOSIS — Y92410 Unspecified street and highway as the place of occurrence of the external cause: Secondary | ICD-10-CM | POA: Insufficient documentation

## 2021-05-20 DIAGNOSIS — M545 Low back pain, unspecified: Secondary | ICD-10-CM | POA: Insufficient documentation

## 2021-05-20 MED ORDER — METHOCARBAMOL 500 MG PO TABS: 500.0000 mg | ORAL_TABLET | Freq: Three times a day (TID) | ORAL | 0 refills | Status: AC | PRN

## 2021-05-20 MED ORDER — NAPROXEN 500 MG PO TABS: 500.0000 mg | ORAL_TABLET | Freq: Two times a day (BID) | ORAL | 0 refills | Status: AC | PRN
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
## 2021-05-21 ENCOUNTER — Encounter (HOSPITAL_BASED_OUTPATIENT_CLINIC_OR_DEPARTMENT_OTHER): Payer: Self-pay | Admitting: Emergency Medicine

## 2021-05-21 ENCOUNTER — Emergency Department (HOSPITAL_BASED_OUTPATIENT_CLINIC_OR_DEPARTMENT_OTHER)
Admission: EM | Admit: 2021-05-21 | Discharge: 2021-05-21 | Disposition: A | Discharge status: Home / Self Care | Payer: Medicaid Other | Attending: Emergency Medicine | Admitting: Emergency Medicine

## 2021-05-21 DIAGNOSIS — R519 Headache, unspecified: Secondary | ICD-10-CM | POA: Insufficient documentation

## 2021-05-21 DIAGNOSIS — Z5321 Procedure and treatment not carried out due to patient leaving prior to being seen by health care provider: Secondary | ICD-10-CM | POA: Insufficient documentation

## 2021-05-21 NOTE — ED Triage Notes (Signed)
Pt is c/o headache and neck hurts  Pt states she was given a prescription today and she has taken tylenol  Pt states she was seen on Monday for same

## 2021-06-06 IMAGING — CR PORTABLE ABDOMEN - 1 VIEW
1 series · 2 of 2 positions shown · non-contrast
Comparison: No priors.

CLINICAL DATA: 47-year-old female with history of hysterectomy.
Incorrect instrument count. Evaluate for foreign body.

EXAM:
PORTABLE ABDOMEN - 1 VIEW

[Series 1: abdomen kub · 0.14mm/px · 2 of 2 slices shown]
[im 1/2]
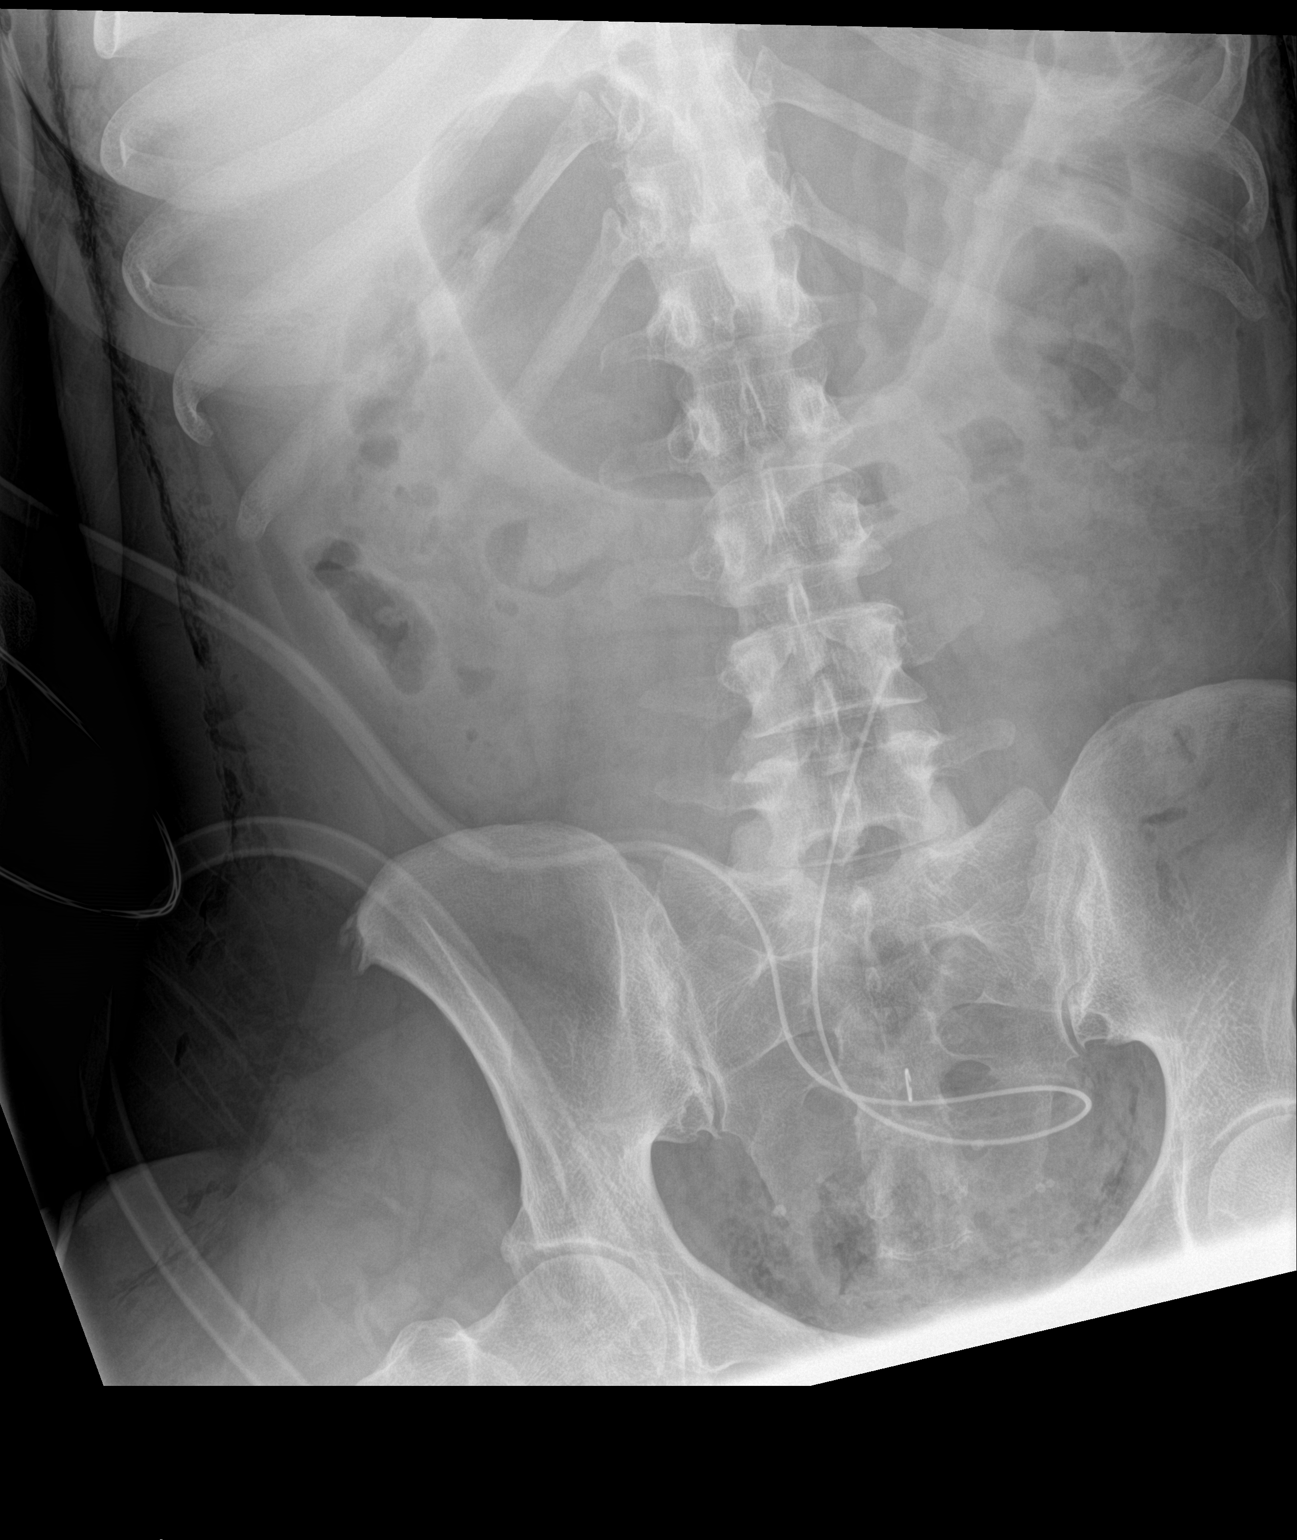
[im 2/2]
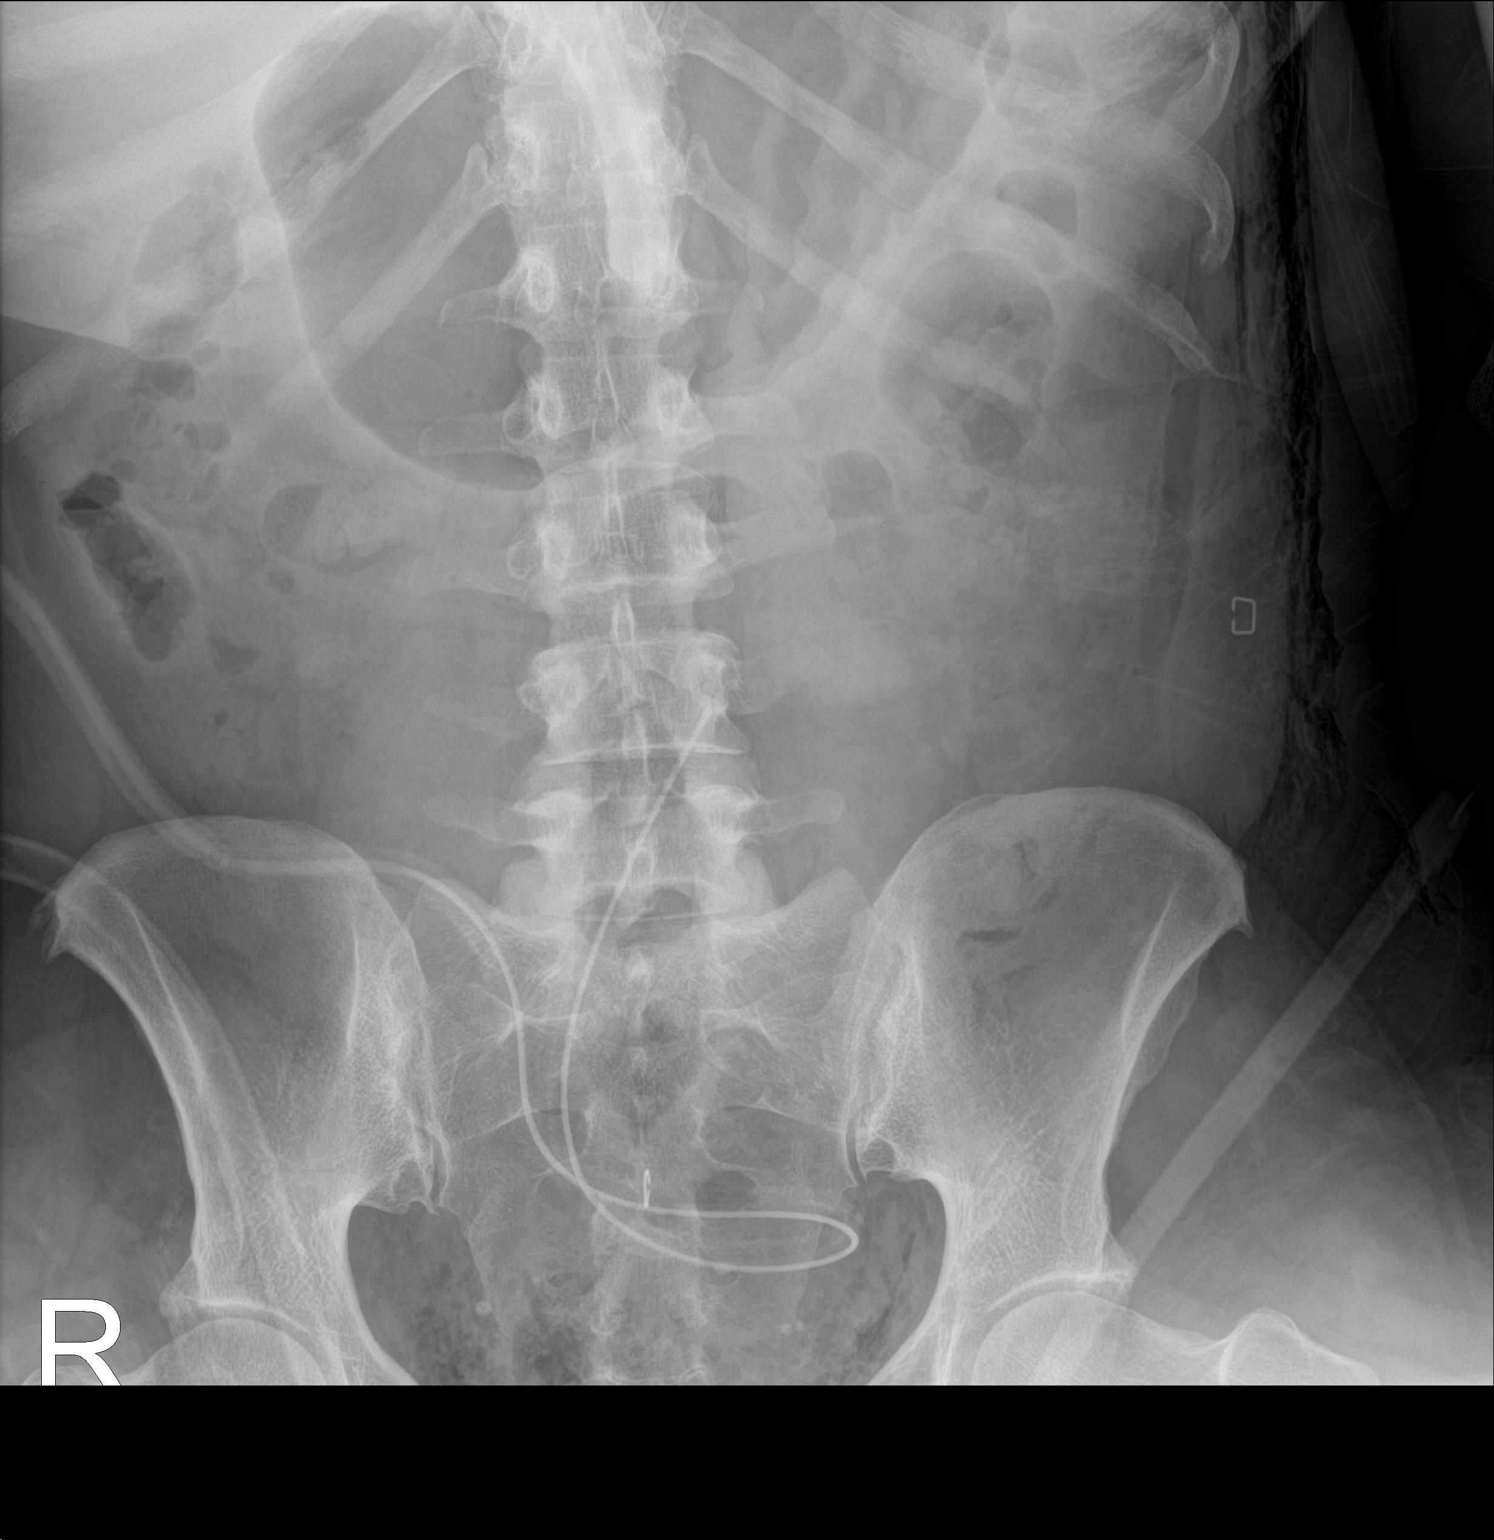

[2 of 2 positions shown; findings below may reference images not displayed]

FINDINGS: Two views of the abdomen demonstrate a surgical drain coiled in the
pelvis with tip extending into the mid abdomen. Surgical clip in the
mid central pelvis. Additional surgical staple projecting over the
left lateral abdomen. No other radiopaque foreign body identified.
Gas in the flank soft tissues bilaterally. Gas and stool noted
throughout the colon extending into the distal rectum. No pathologic
dilatation of small bowel or colon.
IMPRESSION: 1. Postoperative changes, as above. No definite unexpected
radiopaque foreign body, as above.

These results were called by telephone at the time of interpretation
on 03/09/2019 at [DATE] to nurse Locklear for Dr. PAULUS N CEEJAY, who
verbally acknowledged these results.

## 2022-11-23 ENCOUNTER — Other Ambulatory Visit: Payer: Self-pay

## 2022-11-23 ENCOUNTER — Encounter (HOSPITAL_BASED_OUTPATIENT_CLINIC_OR_DEPARTMENT_OTHER): Payer: Self-pay | Admitting: Emergency Medicine

## 2022-11-23 ENCOUNTER — Emergency Department (HOSPITAL_BASED_OUTPATIENT_CLINIC_OR_DEPARTMENT_OTHER)
Admission: EM | Admit: 2022-11-23 | Discharge: 2022-11-23 | Disposition: A | Discharge status: Home / Self Care | Payer: Medicaid Other | Attending: Emergency Medicine | Admitting: Emergency Medicine

## 2022-11-23 DIAGNOSIS — W19XXXA Unspecified fall, initial encounter: Secondary | ICD-10-CM

## 2022-11-23 DIAGNOSIS — S39012A Strain of muscle, fascia and tendon of lower back, initial encounter: Secondary | ICD-10-CM | POA: Diagnosis not present

## 2022-11-23 DIAGNOSIS — W010XXA Fall on same level from slipping, tripping and stumbling without subsequent striking against object, initial encounter: Secondary | ICD-10-CM | POA: Diagnosis not present

## 2022-11-23 DIAGNOSIS — Y92512 Supermarket, store or market as the place of occurrence of the external cause: Secondary | ICD-10-CM | POA: Insufficient documentation

## 2022-11-23 DIAGNOSIS — S46812A Strain of other muscles, fascia and tendons at shoulder and upper arm level, left arm, initial encounter: Secondary | ICD-10-CM | POA: Diagnosis not present

## 2022-11-23 MED ORDER — IBUPROFEN 800 MG PO TABS
800.0000 mg | ORAL_TABLET | Freq: Once | ORAL | Status: AC
  Administered 2022-11-23: 800 mg via ORAL
  Filled 2022-11-23: qty 1

## 2022-11-23 NOTE — ED Provider Notes (Signed)
[facility] Provider Note   CSN: [id number] Arrival date & time: 11/23/22  0913     History  Chief Complaint  Patient presents with   Tasha Payne is a 52 y.o. female.  HPI 53 year old female presents after a slip and fall in the grocery store yesterday.  She states she slipped and fell backwards.  Did not hit her head or lose consciousness.  No headache but is having some left-sided neck/trapezius pain as well as low back pain.  Symptoms seem to be worse throughout the night.  She took some Tylenol with partial relief.  No weakness or numbness, vision changes, chest pain, shortness of breath.  Home Medications Prior to Admission medications   Medication Sig Start Date End Date Taking? Authorizing Provider  docusate sodium (COLACE) 100 MG capsule Take 1 capsule (100 mg total) by mouth 2 (two) times daily. 03/15/19   Sloan Leiter, MD  ibuprofen (ADVIL) 600 MG tablet Take 1 tablet (600 mg total) by mouth every 6 (six) hours as needed for fever, headache or moderate pain. 04/30/19   Sloan Leiter, MD  methocarbamol (ROBAXIN) 500 MG tablet Take 1 tablet (500 mg total) by mouth every 8 (eight) hours as needed for muscle spasms. 05/20/21   Petrucelli, Samantha R, PA-C  naproxen (NAPROSYN) 500 MG tablet Take 1 tablet (500 mg total) by mouth 2 (two) times daily as needed for moderate pain. 05/20/21   Petrucelli, Glynda Jaeger, PA-C      Allergies    Patient has no known allergies.    Review of Systems   Review of Systems  Respiratory:  Negative for shortness of breath.   Cardiovascular:  Negative for chest pain.  Gastrointestinal:  Negative for abdominal pain.  Musculoskeletal:  Positive for back pain and neck pain.  Neurological:  Negative for weakness, numbness and headaches.    Physical Exam Updated Vital Signs BP (!) 149/97 (BP Location: Left Arm)   Pulse 67   Temp 98.1 F (36.7 C) (Oral)   Resp 16   Ht 5\' 4"  (1.626 m)    Wt 63.5 kg   LMP 02/02/2019 (Approximate)   SpO2 100%   BMI 24.03 kg/m  Physical Exam Vitals and nursing note reviewed.  Constitutional:      Appearance: She is well-developed.  HENT:     Head: Normocephalic and atraumatic.  Neck:   Cardiovascular:     Rate and Rhythm: Normal rate and regular rhythm.     Heart sounds: Normal heart sounds.  Pulmonary:     Effort: Pulmonary effort is normal.     Breath sounds: Normal breath sounds.  Abdominal:     Palpations: Abdomen is soft.     Tenderness: There is no abdominal tenderness.  Musculoskeletal:     Left shoulder: No tenderness. Decreased range of motion (moving shoulder hurts her trapezius).     Cervical back: Muscular tenderness present. No spinous process tenderness.     Thoracic back: No tenderness.     Lumbar back: Tenderness present.       Back:  Skin:    General: Skin is warm and dry.  Neurological:     Mental Status: She is alert.     Comments: CN 3-12 grossly intact. 5/5 strength in all 4 extremities. Grossly normal sensation     ED Results / Procedures / Treatments   Labs (all labs ordered are listed, but only abnormal results are displayed)  Labs Reviewed - No data to display  EKG None  Radiology No results found.  Procedures Procedures    Medications Ordered in ED Medications  ibuprofen (ADVIL) tablet 800 mg (has no administration in time range)    ED Course/ Medical Decision Making/ A&P                             Medical Decision Making Risk Prescription drug management.   Patient presents after a slip and fall.  Seems to have muscular injuries.  I have low suspicion for bony injuries, especially in her neck with no midline/bony tenderness.  Her shoulder is not tender though it does hurt her trapezius when she moves her shoulder.  Otherwise she has some tenderness right at her lumbar low left back and the top of her pelvic bone.  However, she has been ambulating normally with no pain and has  no hip pain or tenderness.  My suspicion that she has a bony fracture is pretty low.  Offered we could do x-rays though with low suspicion this might not be necessary.  We decided together to hold off on x-ray imaging.  Will treat supportively with ice, ibuprofen, Tylenol, etc.  Discussed return precautions but I have low suspicion for significant injury at this time.        Final Clinical Impression(s) / ED Diagnoses Final diagnoses:  Fall, initial encounter  Trapezius strain, left, initial encounter  Strain of lumbar region, initial encounter    Rx / DC Orders ED Discharge Orders     None         Sherwood Gambler, MD 11/23/22 [phone]

## 2022-11-23 NOTE — Discharge Instructions (Signed)
Be sure to apply ice to the affected areas and he may take ibuprofen and Tylenol for pain/discomfort.  If you develop new or worsening pain or if you develop headache, vision changes, numbness or weakness, or any other new/concerning symptoms then return to the ER or call 911.
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
